# Patient Record
Sex: Male | Born: 1935 | Race: White | Hispanic: No | Marital: Married | Smoking: Never smoker
Health system: Southern US, Community
[De-identification: ages and names within clinical notes are randomized; demographics above are authoritative.]

## PROBLEM LIST (undated history)

## (undated) DIAGNOSIS — I10 Essential (primary) hypertension: Secondary | ICD-10-CM

## (undated) DIAGNOSIS — E7800 Pure hypercholesterolemia, unspecified: Secondary | ICD-10-CM

---

## 2018-12-24 ENCOUNTER — Emergency Department: Payer: Medicare Other

## 2018-12-24 ENCOUNTER — Other Ambulatory Visit: Payer: Self-pay

## 2018-12-24 ENCOUNTER — Encounter: Payer: Self-pay | Admitting: Emergency Medicine

## 2018-12-24 ENCOUNTER — Observation Stay
Admission: EM | Admit: 2018-12-24 | Discharge: 2018-12-25 | Disposition: A | Discharge status: Home / Self Care | Payer: Medicare Other | Attending: Internal Medicine | Admitting: Internal Medicine

## 2018-12-24 DIAGNOSIS — M160 Bilateral primary osteoarthritis of hip: Secondary | ICD-10-CM | POA: Diagnosis not present

## 2018-12-24 DIAGNOSIS — R262 Difficulty in walking, not elsewhere classified: Secondary | ICD-10-CM | POA: Insufficient documentation

## 2018-12-24 DIAGNOSIS — Z7982 Long term (current) use of aspirin: Secondary | ICD-10-CM | POA: Insufficient documentation

## 2018-12-24 DIAGNOSIS — E785 Hyperlipidemia, unspecified: Secondary | ICD-10-CM

## 2018-12-24 DIAGNOSIS — M5416 Radiculopathy, lumbar region: Secondary | ICD-10-CM | POA: Diagnosis not present

## 2018-12-24 DIAGNOSIS — I700 Atherosclerosis of aorta: Secondary | ICD-10-CM | POA: Diagnosis not present

## 2018-12-24 DIAGNOSIS — M8580 Other specified disorders of bone density and structure, unspecified site: Secondary | ICD-10-CM | POA: Diagnosis not present

## 2018-12-24 DIAGNOSIS — I10 Essential (primary) hypertension: Secondary | ICD-10-CM | POA: Insufficient documentation

## 2018-12-24 DIAGNOSIS — Z8042 Family history of malignant neoplasm of prostate: Secondary | ICD-10-CM | POA: Insufficient documentation

## 2018-12-24 DIAGNOSIS — M545 Low back pain, unspecified: Secondary | ICD-10-CM

## 2018-12-24 DIAGNOSIS — G8929 Other chronic pain: Secondary | ICD-10-CM | POA: Insufficient documentation

## 2018-12-24 DIAGNOSIS — E7800 Pure hypercholesterolemia, unspecified: Secondary | ICD-10-CM | POA: Diagnosis not present

## 2018-12-24 DIAGNOSIS — R600 Localized edema: Secondary | ICD-10-CM

## 2018-12-24 DIAGNOSIS — Z79899 Other long term (current) drug therapy: Secondary | ICD-10-CM | POA: Insufficient documentation

## 2018-12-24 DIAGNOSIS — M533 Sacrococcygeal disorders, not elsewhere classified: Secondary | ICD-10-CM | POA: Diagnosis not present

## 2018-12-24 DIAGNOSIS — K4020 Bilateral inguinal hernia, without obstruction or gangrene, not specified as recurrent: Secondary | ICD-10-CM | POA: Insufficient documentation

## 2018-12-24 DIAGNOSIS — M4316 Spondylolisthesis, lumbar region: Secondary | ICD-10-CM | POA: Insufficient documentation

## 2018-12-24 DIAGNOSIS — M48061 Spinal stenosis, lumbar region without neurogenic claudication: Secondary | ICD-10-CM | POA: Diagnosis not present

## 2018-12-24 DIAGNOSIS — M549 Dorsalgia, unspecified: Secondary | ICD-10-CM | POA: Diagnosis present

## 2018-12-24 DIAGNOSIS — Z20828 Contact with and (suspected) exposure to other viral communicable diseases: Secondary | ICD-10-CM | POA: Insufficient documentation

## 2018-12-24 DIAGNOSIS — M5441 Lumbago with sciatica, right side: Secondary | ICD-10-CM | POA: Diagnosis not present

## 2018-12-24 DIAGNOSIS — M5137 Other intervertebral disc degeneration, lumbosacral region: Secondary | ICD-10-CM | POA: Insufficient documentation

## 2018-12-24 DIAGNOSIS — Z809 Family history of malignant neoplasm, unspecified: Secondary | ICD-10-CM | POA: Insufficient documentation

## 2018-12-24 HISTORY — DX: Pure hypercholesterolemia, unspecified: E78.00

## 2018-12-24 HISTORY — DX: Essential (primary) hypertension: I10

## 2018-12-24 LAB — COMPREHENSIVE METABOLIC PANEL
ALT: 19 U/L (ref 0–44)
AST: 23 U/L (ref 15–41)
Albumin: 3.8 g/dL (ref 3.5–5.0)
Alkaline Phosphatase: 44 U/L (ref 38–126)
Anion gap: 12 (ref 5–15)
BUN: 21 mg/dL (ref 8–23)
CO2: 21 mmol/L — ABNORMAL LOW (ref 22–32)
Calcium: 8.9 mg/dL (ref 8.9–10.3)
Chloride: 99 mmol/L (ref 98–111)
Creatinine, Ser: 1 mg/dL (ref 0.61–1.24)
GFR calc Af Amer: >60 mL/min (ref >60)
GFR calc non Af Amer: >60 mL/min (ref >60)
Glucose, Bld: 110 mg/dL — ABNORMAL HIGH (ref 70–99)
Potassium: 3.9 mmol/L (ref 3.5–5.1)
Sodium: 132 mmol/L — ABNORMAL LOW (ref 135–145)
Total Bilirubin: 1.2 mg/dL (ref 0.3–1.2)
Total Protein: 7.1 g/dL (ref 6.5–8.1)

## 2018-12-24 LAB — CBC WITH DIFFERENTIAL/PLATELET
Abs Immature Granulocytes: 0.02 10*3/uL (ref 0.00–0.07)
Basophils Absolute: 0 10*3/uL (ref 0.0–0.1)
Basophils Relative: 0 %
Eosinophils Absolute: 0 10*3/uL (ref 0.0–0.5)
Eosinophils Relative: 1 %
HCT: 40.3 % (ref 39.0–52.0)
Hemoglobin: 14 g/dL (ref 13.0–17.0)
Immature Granulocytes: 0 %
Lymphocytes Relative: 19 %
Lymphs Abs: 1.4 10*3/uL (ref 0.7–4.0)
MCH: 30.5 pg (ref 26.0–34.0)
MCHC: 34.7 g/dL (ref 30.0–36.0)
MCV: 87.8 fL (ref 80.0–100.0)
Monocytes Absolute: 0.7 10*3/uL (ref 0.1–1.0)
Monocytes Relative: 9 %
Neutro Abs: 5.2 10*3/uL (ref 1.7–7.7)
Neutrophils Relative %: 71 %
Platelets: 182 10*3/uL (ref 150–400)
RBC: 4.59 MIL/uL (ref 4.22–5.81)
RDW: 13.2 % (ref 11.5–15.5)
WBC: 7.4 10*3/uL (ref 4.0–10.5)
nRBC: 0 % (ref 0.0–0.2)

## 2018-12-24 MED ORDER — HYDROMORPHONE HCL 1 MG/ML IJ SOLN: 0.5000 mg | INTRAMUSCULAR | Status: DC | PRN

## 2018-12-24 MED ORDER — ONDANSETRON HCL 4 MG/2ML IJ SOLN
4.0000 mg | Freq: Once | INTRAMUSCULAR | Status: AC
  Administered 2018-12-24: 4 mg via INTRAVENOUS
  Filled 2018-12-24: qty 2

## 2018-12-24 MED ORDER — MORPHINE SULFATE (PF) 4 MG/ML IV SOLN
4.0000 mg | Freq: Once | INTRAVENOUS | Status: AC
  Administered 2018-12-24: 4 mg via INTRAVENOUS
  Filled 2018-12-24: qty 1

## 2018-12-24 MED ORDER — HYDROCHLOROTHIAZIDE 12.5 MG PO CAPS
12.5000 mg | ORAL_CAPSULE | Freq: Every day | ORAL | Status: DC
  Administered 2018-12-25: 12.5 mg via ORAL
  Filled 2018-12-24 (×2): qty 1

## 2018-12-24 MED ORDER — HYDROCODONE-ACETAMINOPHEN 5-325 MG PO TABS
1.0000 | ORAL_TABLET | ORAL | Status: DC | PRN
  Administered 2018-12-25: 1 via ORAL
  Filled 2018-12-24: qty 1

## 2018-12-24 MED ORDER — ASPIRIN 81 MG PO CHEW
81.0000 mg | CHEWABLE_TABLET | Freq: Every day | ORAL | Status: DC
  Administered 2018-12-24 – 2018-12-25 (×2): 81 mg via ORAL
  Filled 2018-12-24 (×2): qty 1

## 2018-12-24 MED ORDER — HYDROMORPHONE HCL 1 MG/ML IJ SOLN
0.5000 mg | Freq: Once | INTRAMUSCULAR | Status: AC
  Administered 2018-12-24: 0.5 mg via INTRAVENOUS
  Filled 2018-12-24: qty 1

## 2018-12-24 MED ORDER — LISINOPRIL-HYDROCHLOROTHIAZIDE 10-12.5 MG PO TABS: 1.0000 | ORAL_TABLET | Freq: Every day | ORAL | Status: DC

## 2018-12-24 MED ORDER — ACETAMINOPHEN 325 MG PO TABS: 650.0000 mg | ORAL_TABLET | Freq: Four times a day (QID) | ORAL | Status: DC | PRN

## 2018-12-24 MED ORDER — METHOCARBAMOL 500 MG PO TABS
500.0000 mg | ORAL_TABLET | Freq: Three times a day (TID) | ORAL | Status: DC | PRN
  Filled 2018-12-24: qty 1

## 2018-12-24 MED ORDER — ONDANSETRON HCL 4 MG/2ML IJ SOLN: 4.0000 mg | Freq: Four times a day (QID) | INTRAMUSCULAR | Status: DC | PRN

## 2018-12-24 MED ORDER — ONDANSETRON HCL 4 MG PO TABS: 4.0000 mg | ORAL_TABLET | Freq: Four times a day (QID) | ORAL | Status: DC | PRN

## 2018-12-24 MED ORDER — SODIUM CHLORIDE 0.9 % IV SOLN
INTRAVENOUS | Status: AC
  Administered 2018-12-24 – 2018-12-25 (×2): via INTRAVENOUS

## 2018-12-24 MED ORDER — LISINOPRIL 10 MG PO TABS
10.0000 mg | ORAL_TABLET | Freq: Every day | ORAL | Status: DC
  Administered 2018-12-25: 10 mg via ORAL
  Filled 2018-12-24: qty 1

## 2018-12-24 MED ORDER — ACETAMINOPHEN 650 MG RE SUPP: 650.0000 mg | Freq: Four times a day (QID) | RECTAL | Status: DC | PRN

## 2018-12-24 MED ORDER — ROSUVASTATIN CALCIUM 10 MG PO TABS
10.0000 mg | ORAL_TABLET | Freq: Every day | ORAL | Status: DC
  Administered 2018-12-24 – 2018-12-25 (×2): 10 mg via ORAL
  Filled 2018-12-24 (×3): qty 1

## 2018-12-24 NOTE — ED Notes (Signed)
Report given to Jeanette Caprice, [place]

## 2018-12-24 NOTE — H&P (Signed)
Zachary Nichols [id number] DOB: 06/04/1928 DOA: 12/24/2018     PCP: Derinda Late, MD   Outpatient Specialists: NONE    Patient arrived to ER on 12/24/18 at 1517  Patient coming from: home Lives   With family From facility  [facility]  Chief Complaint:   Chief Complaint  Patient presents with   Back Pain    HPI: Zachary Nichols is a 85 y.o. male with medical history significant of HTN  family moved from [place] last year   Presented with   Worsening back pain and right hip with dificulty ambulating For years but got worse for past years uses a walker  He was having more pain and was told to to go to his PCP but could not get into car and ems was called Reported pain getting worse and not radiating to his right leg Wife states she is unable to help him ambulate no longer.  He has known history of spinal stenosis and have been followed up with pain management at [place] in the past.  Denies any numbness in ER was having good rectal tone no weakness in his lower extremities significant pain on ambulation.  Infectious risk factors:  Reports none    in house  PCR testing  Pending  No results found for: SARSCOV2NAA   Regarding pertinent Chronic problems:    Hyperlipidemia -  on statins crestor   HTN on lisinopril HCTZ      While in ER: Noted to have hard time walking Was given IV pain medication and was able to rest   ER Provider Called: neurosurgery      They Recommend admit to medicine   Will see in AM   MRI of the lumbar spine and sacrum was done but was very degraded by motion there was evidence of multiple significant foraminal stenosis throughout the lumbar spine likely explaining his symptoms   recommend a CT scan which was Done showing spine spondylosis most notable likely at L3-L4 and L4-L5 The following Work up has been ordered so far:  Orders Placed This Encounter  Procedures   SARS CORONAVIRUS 2 (TAT 6-24 HRS) Nasopharyngeal  Nasopharyngeal Swab   MR LUMBAR SPINE WO CONTRAST   MR SACRUM SI JOINTS WO CONTRAST   CT Lumbar Spine Wo Contrast   CT PELVIS WO CONTRAST   Comprehensive metabolic panel   CBC with Differential   Nursing Communication Try to walk patient w/ a walker. Let me see him do it. Conni Slipper MD   Consult to hospitalist  ALL PATIENTS BEING ADMITTED/HAVING PROCEDURES NEED COVID-19 SCREENING wife does not feel she can care for him at home the way he is right now   Consult to social work   Patient needs PT consult as soon as possible.  Discharge is pending PT consult.  Please call  PT eval and treat    Following Medications were ordered in ER: Medications  morphine 4 MG/ML injection 4 mg (4 mg Intravenous Given 12/24/18 1559)  ondansetron (ZOFRAN) injection 4 mg (4 mg Intravenous Given 12/24/18 1559)  HYDROmorphone (DILAUDID) injection 0.5 mg (0.5 mg Intravenous Given 12/24/18 2025)        Consult Orders  (From admission, onward)         Start     Ordered   12/24/18 2049  Consult to social work  Once    Comments: Patient reportedly having trouble walking.  Needs a higher level of care.  Already lives at [facility]  independent living.  Probably could use some physical therapy as well.  Provider:  (Not yet assigned)  Question Answer Comment  Place call to: sw   Reason for Consult Consult      12/24/18 2048   12/24/18 2049  Patient needs PT consult as soon as possible.  Discharge is pending PT consult.  Please call  PT eval and treat  Imminent discharge    Comments: Patient needs PT consult as soon as possible.  Discharge is pending PT consult.  Please call  Question:  Reason for PT?  Answer:  Patient with severe spinal stenosis low back pain having trouble laying down flat and reportedly having trouble walking.   12/24/18 2049   12/24/18 2031  Consult to hospitalist  ALL PATIENTS BEING ADMITTED/HAVING PROCEDURES NEED COVID-19 SCREENING wife does not feel she can care for him at home  the way he is right now  Once    Comments: ALL PATIENTS BEING ADMITTED/HAVING PROCEDURES NEED COVID-19 SCREENING wife does not feel she can care for him at home the way he is right now  Provider:  (Not yet assigned)  Question Answer Comment  Place call to: [phone]   Reason for Consult Admit   Diagnosis/Clinical Info for Consult: Severe back pain unable to lay flat difficulty walking because of the pain      12/24/18 2037            Significant initial  Findings: Abnormal Labs Reviewed  COMPREHENSIVE METABOLIC PANEL - Abnormal; Notable for the following components:      Result Value   Sodium 132 (*)    CO2 21 (*)    Glucose, Bld 110 (*)    All other components within normal limits      Otherwise labs showing:    Recent Labs  Lab 12/24/18 1600  NA 132*  K 3.9  CO2 21*  GLUCOSE 110*  BUN 21  CREATININE 1.00  CALCIUM 8.9    Cr   stable,   Lab Results  Component Value Date   CREATININE 1.00 12/24/2018    Recent Labs  Lab 12/24/18 1600  AST 23  ALT 19  ALKPHOS 44  BILITOT 1.2  PROT 7.1  ALBUMIN 3.8   Lab Results  Component Value Date   CALCIUM 8.9 12/24/2018      WBC      Component Value Date/Time   WBC 7.4 12/24/2018 1600   ANC    Component Value Date/Time   NEUTROABS 5.2 12/24/2018 1600   ALC No components found for: LYMPHAB    Plt: Lab Results  Component Value Date   PLT 182 12/24/2018       HG/HCT  stable,      Component Value Date/Time   HGB 14.0 12/24/2018 1600   HCT 40.3 12/24/2018 1600      Ordered CT/MRI lumbar spine and pelvis showed significant spondylosis at levels L3-L4 and L4-5    ED Triage Vitals  Enc Vitals Group     BP 12/24/18 1527 (!) 160/80     Pulse Rate 12/24/18 1527 75     Resp 12/24/18 1527 18     Temp 12/24/18 1527 98 F (36.7 C)     Temp Source 12/24/18 1527 Oral     SpO2 12/24/18 1527 97 %     Weight 12/24/18 1522 210 lb (95.3 kg)     Height 12/24/18 1522 5\' 8"  (1.727 m)     Head  Circumference --  Peak Flow --      Pain Score 12/24/18 1522 6     Pain Loc --      Pain Edu? --      Excl. in [place]? --   TMAX(24)@       Latest  Blood pressure (!) 160/80, pulse 75, temperature 98 F (36.7 C), temperature source Oral, resp. rate 18, height 5\' 8"  (1.727 m), weight 95.3 kg, SpO2 97 %.    Hospitalist was called for admission for severe back pain   Review of Systems:    Pertinent positives include: Back pain  Constitutional:  No weight loss, night sweats, Fevers, chills, fatigue, weight loss  HEENT:  No headaches, Difficulty swallowing,Tooth/dental problems,Sore throat,  No sneezing, itching, ear ache, nasal congestion, post nasal drip,  Cardio-vascular:  No chest pain, Orthopnea, PND, anasarca, dizziness, palpitations.no Bilateral lower extremity swelling  GI:  No heartburn, indigestion, abdominal pain, nausea, vomiting, diarrhea, change in bowel habits, loss of appetite, melena, blood in stool, hematemesis Resp:  no shortness of breath at rest. No dyspnea on exertion, No excess mucus, no productive cough, No non-productive cough, No coughing up of blood.No change in color of mucus.No wheezing. Skin:  no rash or lesions. No jaundice GU:  no dysuria, change in color of urine, no urgency or frequency. No straining to urinate.  No flank pain.  Musculoskeletal:  No joint pain or no joint swelling. No decreased range of motion.   Psych:  No change in mood or affect. No depression or anxiety. No memory loss.  Neuro: no localizing neurological complaints, no tingling, no weakness, no double vision, no gait abnormality, no slurred speech, no confusion  All systems reviewed and apart from [place] all are negative  Past Medical History:   Past Medical History:  Diagnosis Date   Hypercholesteremia    Hypertension       History reviewed. No pertinent surgical history.  Social History:  Ambulatory   Gilford Rile      reports that he has never smoked. He has  never used smokeless tobacco. He reports that he does not drink alcohol. No history on file for drug.     Family History:   Family History  Problem Relation Age of Onset   Cancer Mother    Bone cancer Mother    Prostate cancer Father     Allergies: No Known Allergies   Prior to Admission medications   Medication Sig Start Date End Date Taking? Authorizing Provider  aspirin 81 MG chewable tablet Chew 81 mg by mouth daily.   Yes [provider]  lisinopril-hydrochlorothiazide (ZESTORETIC) 10-12.5 MG tablet Take 1 tablet by mouth daily.   Yes [provider]  rosuvastatin (CRESTOR) 10 MG tablet Take 10 mg by mouth daily.   Yes [provider]   Physical Exam: Blood pressure (!) 160/80, pulse 75, temperature 98 F (36.7 C), temperature source Oral, resp. rate 18, height 5\' 8"  (1.727 m), weight 95.3 kg, SpO2 97 %. 1. General:  in No  Acute distress when resting in bed  Chronically ill  -appearing 2. Psychological: Alert and  Oriented 3. Head/ENT:    Dry Mucous Membranes                          Head Non traumatic, neck supple                            Poor  Dentition 4. SKIN:   decreased Skin turgor,  Skin clean Dry and intact no rash 5. Heart: Regular rate and rhythm no  Murmur, no Rub or gallop 6. Lungs:  , no wheezes or crackles   7. Abdomen: Soft,  non-tender, Non distended bowel sounds present 8. Lower extremities: no clubbing, cyanosis, slight edema left worse than right 9. Neurologically Grossly intact, moving all 4 extremities equally   10. MSK: Normal range of motion   All other LABS:     Recent Labs  Lab 12/24/18 1600  WBC 7.4  NEUTROABS 5.2  HGB 14.0  HCT 40.3  MCV 87.8  PLT 182     Recent Labs  Lab 12/24/18 1600  NA 132*  K 3.9  CL 99  CO2 21*  GLUCOSE 110*  BUN 21  CREATININE 1.00  CALCIUM 8.9     Recent Labs  Lab 12/24/18 1600  AST 23  ALT 19  ALKPHOS 44  BILITOT 1.2  PROT 7.1  ALBUMIN 3.8         Cultures: No results found for: SDES, SPECREQUEST, CULT, REPTSTATUS   Radiological Exams on Admission: Ct Lumbar Spine Wo Contrast  Result Date: 12/24/2018 CLINICAL DATA:  Lower back pain, worse on the right, recent MRI had limited evaluation EXAM: CT LUMBAR SPINE WITHOUT CONTRAST TECHNIQUE: Multidetector CT imaging of the lumbar spine was performed without intravenous contrast administration. Multiplanar CT image reconstructions were also generated. COMPARISON:  None. FINDINGS: Segmentation: There are 5 non-rib bearing lumbar type vertebral bodies with the last intervertebral disc space labeled as L5-S1. Alignment: There is a dextroconvex scoliotic curvature of the lumbar spine. There is posterior XLif fixation is seen at L3-L4 and L4-L5. Vertebrae: The vertebral body heights are well maintained. No fracture, malalignment, or pathologic osseous lesions seen. Paraspinal and other soft tissues: The paraspinal soft tissues and visualized retroperitoneal structures are unremarkable. Scattered aortic atherosclerosis noted. The sacroiliac joints are intact. Disc levels: T12-L1:  No significant canal or neural foraminal narrowing. L1-L2: There is facet arthrosis which causes severe left and moderate right neural foraminal narrowing. There is also moderate central canal stenosis. L2-L3: Facet arthrosis causes severe left and moderate right neural foraminal narrowing. There is severe central canal stenosis central canal stenosis. L3-L4: There is facet arthrosis which causes moderate bilateral neural foraminal narrowing. There is also severe central canal stenosis, somewhat limited due to overlying streak artifact. L4-L5: There is facet arthrosis which causes severe bilateral neural foraminal narrowing. There also appears to be severe central canal stenosis, somewhat limited due to overlying streak artifact. L5-S1: There is facet arthrosis which causes severe left and moderate right neural foraminal narrowing.  IMPRESSION: 1. No acute fracture or malalignment. 2. Dextroconvex scoliotic curvature of the lumbar spine. 3. Posterior XLIF  at L3-L4 and L4-L5. 4. Lumbar spine spondylosis most notable likely at L3-L4 and L4-L5 although somewhat limited due to overlying streak artifact and technique. Electronically Signed   By: Prudencio Pair M.D.   On: 12/24/2018 20:10   Ct Pelvis Wo Contrast  Result Date: 12/24/2018 CLINICAL DATA:  Enlarged lymph nodes sacral pain EXAM: CT PELVIS WITHOUT CONTRAST TECHNIQUE: Multidetector CT imaging of the pelvis was performed following the standard protocol without intravenous contrast. COMPARISON:  None. FINDINGS: Urinary Tract: The visualized distal ureters and bladder appear unremarkable. Bowel: No bowel wall thickening, distention or surrounding inflammation identified within the pelvis. Scattered colonic diverticula are noted. Vascular/Lymphatic: No enlarged pelvic lymph nodes identified. Scattered aortic atherosclerotic calcifications are seen without aneurysmal  dilatation. Reproductive:   The prostate is mildly enlarged and heterogeneous. Other: Bilateral fat containing inguinal hernias are present. No adenopathy seen within the inguinal regions. Musculoskeletal: No fracture or dislocation is seen. There is diffuse osteopenia. There is moderate to advanced bilateral hip osteoarthritis with superior subchondral cystic changes. There is mild fatty atrophy noted within the muscles surrounding the pelvis. No focal soft tissue swelling or fluid collection. IMPRESSION: 1. No acute fracture or malalignment of the pelvis. 2. Diffuse osteopenia. 3. Moderate to advanced bilateral hip osteoarthritis. 4.  Aortic Atherosclerosis (ICD10-I70.0). 5. Diverticulosis. Electronically Signed   By: Prudencio Pair M.D.   On: 12/24/2018 20:13   Mr Lumbar Spine Wo Contrast  Result Date: 12/24/2018 CLINICAL DATA:  Low back pain.  Right-sided pain with right leg pain EXAM: MRI LUMBAR SPINE AND SACRUM WITHOUT  CONTRAST TECHNIQUE: Multiplanar, multisequence MR imaging of the lumbar spine was performed. No intravenous contrast was administered. COMPARISON:  None. FINDINGS: Segmentation: Exam quality is limited by extensive motion. Multiple repeat sequences however the patient continued to move throughout the study. Normal segmentation. Alignment:  Mild anterolisthesis L3-4.  Dextroscoliosis. Vertebrae: No vertebral fracture. There is hardware related artifact in the posterior elements at L4-5 likely inter spinous spacer. No prior radiograph for correlation. Hemangioma T12 vertebral body. Images of the sacrum were attempted however are greatly degraded by motion. There is also artifact posteriorly at L4-5 due to surgical hardware. No gross fracture or mass in the sacrum is identified. Conus medullaris and cauda equina: Conus not well visualized due to extensive motion. The distal cord and conus are grossly normal. Paraspinal and other soft tissues: No paraspinous mass or fluid collection. Disc levels: L1-2: Mild spinal stenosis. Disc degeneration and facet degeneration contributes to stenosis L2-3: Moderate to severe spinal stenosis due disc degeneration and endplate spurring as well as diffuse facet hypertrophy. Moderate subarticular and foraminal stenosis bilaterally L3-4: Severe spinal stenosis. Disc degeneration and diffuse endplate spurring. Bilateral facet hypertrophy. Moderate to severe subarticular and foraminal encroachment bilaterally L4-5: Moderate disc degeneration and spurring. Mild spinal stenosis. Bilateral facet degeneration with moderate subarticular stenosis bilaterally. Posterior element hardware related artifact likely inter spinous spacer. L5-S1: Disc degeneration and spondylosis. Moderate subarticular stenosis bilaterally left greater than right with impingement of the S1 nerve roots specially on the left. IMPRESSION: This study is markedly degraded by motion on all sequences. The patient does have  multiple areas of significant spinal and foraminal stenosis throughout the lumbar spine which may account for the patient's symptoms. No fracture or mass lesion identified. Additional imaging is likely to be useful in confirming these findings. CT lumbar spine could be performed. Pain management is recommended prior to further imaging. Electronically Signed   By: Franchot Gallo M.D.   On: 12/24/2018 17:27   Mr Sacrum Si Joints Wo Contrast  Result Date: 12/24/2018 CLINICAL DATA:  Low back pain.  Right-sided pain with right leg pain EXAM: MRI LUMBAR SPINE AND SACRUM WITHOUT CONTRAST TECHNIQUE: Multiplanar, multisequence MR imaging of the lumbar spine was performed. No intravenous contrast was administered. COMPARISON:  None. FINDINGS: Segmentation: Exam quality is limited by extensive motion. Multiple repeat sequences however the patient continued to move throughout the study. Normal segmentation. Alignment:  Mild anterolisthesis L3-4.  Dextroscoliosis. Vertebrae: No vertebral fracture. There is hardware related artifact in the posterior elements at L4-5 likely inter spinous spacer. No prior radiograph for correlation. Hemangioma T12 vertebral body. Images of the sacrum were attempted however are greatly degraded by motion. There is also  artifact posteriorly at L4-5 due to surgical hardware. No gross fracture or mass in the sacrum is identified. Conus medullaris and cauda equina: Conus not well visualized due to extensive motion. The distal cord and conus are grossly normal. Paraspinal and other soft tissues: No paraspinous mass or fluid collection. Disc levels: L1-2: Mild spinal stenosis. Disc degeneration and facet degeneration contributes to stenosis L2-3: Moderate to severe spinal stenosis due disc degeneration and endplate spurring as well as diffuse facet hypertrophy. Moderate subarticular and foraminal stenosis bilaterally L3-4: Severe spinal stenosis. Disc degeneration and diffuse endplate spurring.  Bilateral facet hypertrophy. Moderate to severe subarticular and foraminal encroachment bilaterally L4-5: Moderate disc degeneration and spurring. Mild spinal stenosis. Bilateral facet degeneration with moderate subarticular stenosis bilaterally. Posterior element hardware related artifact likely inter spinous spacer. L5-S1: Disc degeneration and spondylosis. Moderate subarticular stenosis bilaterally left greater than right with impingement of the S1 nerve roots specially on the left. IMPRESSION: This study is markedly degraded by motion on all sequences. The patient does have multiple areas of significant spinal and foraminal stenosis throughout the lumbar spine which may account for the patient's symptoms. No fracture or mass lesion identified. Additional imaging is likely to be useful in confirming these findings. CT lumbar spine could be performed. Pain management is recommended prior to further imaging. Electronically Signed   By: Franchot Gallo M.D.   On: 12/24/2018 17:27    Chart has been reviewed    Assessment/Plan  85 y.o. male with medical history significant of HTN  family moved from [place] last year Admitted for severe back pain  Present on Admission:  Back pain - due to spinal stenosis, pain control, PT/OT assessment, Neurosurgery consult and possible placement   Essential hypertension - chronic continue home medications   Hyperlipidemia - stable continue home medications   Leg edema, left - obtain dopplers to further assess   Other plan as per orders.  DVT prophylaxis:  SCD    Code Status:    DNR/DNI  as per patient  I had personally discussed CODE STATUS with patient   Family Communication:   Family  at  Bedside  plan of care was discussed   with  Wife,   Disposition Plan:     likely will need placement for rehabilitation                                                 Would benefit from PT/OT eval prior to DC  Ordered                                          Consults called:    NS aware will see in AM  Admission status:  ED Disposition    ED Disposition Condition Comment   Admit  The patient appears reasonably stabilized for admission considering the current resources, flow, and capabilities available in the ED at this time, and I doubt any other [facility] requiring further screening and/or treatment in the ED prior to admission is  present.       Obs     Level of care        medical floor           Precautions: admitted as  asymptomatic screening protocol  No active isolations    PPE: Used by the provider:   P100  eye Goggles,  Gloves     Zachary Nichols 12/25/2018, 12:42 AM    [facility]     after 2 AM please page floor coverage PA If 7AM-7PM, please contact the day team taking care of the patient using [email or web address]

## 2018-12-24 NOTE — ED Triage Notes (Signed)
Presents via EMS from home  Pt lives at [facility] he is having lower back pain  States pain is worse on the right and moving into right leg

## 2018-12-24 NOTE — ED Provider Notes (Signed)
[facility] Emergency Department Provider Note   ____________________________________________   First MD Initiated Contact with Patient 12/24/18 1527     (approximate)  I have reviewed the triage vital signs and the nursing notes.   HISTORY  Chief Complaint Back Pain   HPI Aithan Baccam is a 85 y.o. male who reports intermittent low back pain off and on for years.  Nothing seems to bring it on or make it better per his report.  Apparently has gotten worse in the last week and is now quite painful in the low back actually in the sacral area.  His wife reports his legs are not working as well.  She says she he had a history of spinal stenosis in the low back in the past.   Patient denies any numbness in his privates.  He has good rectal tone.  He has no weakness in his legs or numbness in his legs per his report.      Past Medical History:  Diagnosis Date   Hypercholesteremia    Hypertension     Patient Active Problem List   Diagnosis Date Noted   Back pain 12/24/2018     Prior to Admission medications   Medication Sig Start Date End Date Taking? Authorizing Provider  aspirin 81 MG chewable tablet Chew 81 mg by mouth daily.   Yes [provider]  lisinopril-hydrochlorothiazide (ZESTORETIC) 10-12.5 MG tablet Take 1 tablet by mouth daily.   Yes [provider]  rosuvastatin (CRESTOR) 10 MG tablet Take 10 mg by mouth daily.   Yes [provider]    Allergies Patient has no known allergies.  Family History  Problem Relation Age of Onset   Cancer Mother    Bone cancer Mother    Prostate cancer Father     Social History Social History   Tobacco Use   Smoking status: Never Smoker   Smokeless tobacco: Never Used  Substance Use Topics   Alcohol use: Never    Frequency: Never   Drug use: Not on file    Review of Systems  Constitutional: No fever/chills Eyes: No visual changes. ENT: No sore  throat. Cardiovascular: Denies chest pain. Respiratory: Denies shortness of breath. Gastrointestinal: No abdominal pain.  No nausea, no vomiting.  No diarrhea.  No constipation. Genitourinary: Negative for dysuria. Musculoskeletal: Negative for back pain. Skin: Negative for rash. Neurological: Negative for headaches, focal weakness    ____________________________________________   PHYSICAL EXAM:  VITAL SIGNS: ED Triage Vitals  Enc Vitals Group     BP 12/24/18 1527 (!) 160/80     Pulse Rate 12/24/18 1527 75     Resp 12/24/18 1527 18     Temp 12/24/18 1527 98 F (36.7 C)     Temp Source 12/24/18 1527 Oral     SpO2 12/24/18 1527 97 %     Weight 12/24/18 1522 210 lb (95.3 kg)     Height 12/24/18 1522 5\' 8"  (1.727 m)     Head Circumference --      Peak Flow --      Pain Score 12/24/18 1522 6     Pain Loc --      Pain Edu? --      Excl. in [place]? --     Constitutional: Alert and oriented. Well appearing and in no acute distress. Eyes: Conjunctivae are normal.  Head: Atraumatic. Nose: No congestion/rhinnorhea. Mouth/Throat: Mucous membranes are moist.  Oropharynx non-erythematous. Neck: No stridor. Cardiovascular: Normal rate, regular rhythm.  Grossly normal heart sounds.  Good peripheral circulation. Respiratory: Normal respiratory effort.  No retractions. Lungs CTAB. Gastrointestinal: Soft and nontender. No distention. No abdominal bruits. No CVA tenderness. Musculoskeletal: No lower extremity tenderness nor edema.   Neurologic:  Normal speech and language. No gross focal neurologic deficits are appreciated. No gait instability. Skin:  Skin is warm, dry and intact. No rash noted. Rectal: Hemoccult negative no masses prostate not tender  ____________________________________________   LABS (all labs ordered are listed, but only abnormal results are displayed)  Labs Reviewed  COMPREHENSIVE METABOLIC PANEL - Abnormal; Notable for the following components:      Result Value    Sodium 132 (*)    CO2 21 (*)    Glucose, Bld 110 (*)    All other components within normal limits  SARS CORONAVIRUS 2 (TAT 6-24 HRS)  CBC WITH DIFFERENTIAL/PLATELET  MAGNESIUM  PHOSPHORUS  TSH  COMPREHENSIVE METABOLIC PANEL  CBC   ____________________________________________  EKG   ____________________________________________  RADIOLOGY  ED MD interpretation: CT and MRI shows spinal stenosis and severe DJD at multiple levels.  Official radiology report(s): Ct Lumbar Spine Wo Contrast  Result Date: 12/24/2018 CLINICAL DATA:  Lower back pain, worse on the right, recent MRI had limited evaluation EXAM: CT LUMBAR SPINE WITHOUT CONTRAST TECHNIQUE: Multidetector CT imaging of the lumbar spine was performed without intravenous contrast administration. Multiplanar CT image reconstructions were also generated. COMPARISON:  None. FINDINGS: Segmentation: There are 5 non-rib bearing lumbar type vertebral bodies with the last intervertebral disc space labeled as L5-S1. Alignment: There is a dextroconvex scoliotic curvature of the lumbar spine. There is posterior XLif fixation is seen at L3-L4 and L4-L5. Vertebrae: The vertebral body heights are well maintained. No fracture, malalignment, or pathologic osseous lesions seen. Paraspinal and other soft tissues: The paraspinal soft tissues and visualized retroperitoneal structures are unremarkable. Scattered aortic atherosclerosis noted. The sacroiliac joints are intact. Disc levels: T12-L1:  No significant canal or neural foraminal narrowing. L1-L2: There is facet arthrosis which causes severe left and moderate right neural foraminal narrowing. There is also moderate central canal stenosis. L2-L3: Facet arthrosis causes severe left and moderate right neural foraminal narrowing. There is severe central canal stenosis central canal stenosis. L3-L4: There is facet arthrosis which causes moderate bilateral neural foraminal narrowing. There is also severe  central canal stenosis, somewhat limited due to overlying streak artifact. L4-L5: There is facet arthrosis which causes severe bilateral neural foraminal narrowing. There also appears to be severe central canal stenosis, somewhat limited due to overlying streak artifact. L5-S1: There is facet arthrosis which causes severe left and moderate right neural foraminal narrowing. IMPRESSION: 1. No acute fracture or malalignment. 2. Dextroconvex scoliotic curvature of the lumbar spine. 3. Posterior XLIF  at L3-L4 and L4-L5. 4. Lumbar spine spondylosis most notable likely at L3-L4 and L4-L5 although somewhat limited due to overlying streak artifact and technique. Electronically Signed   By: Prudencio Pair M.D.   On: 12/24/2018 20:10   Ct Pelvis Wo Contrast  Result Date: 12/24/2018 CLINICAL DATA:  Enlarged lymph nodes sacral pain EXAM: CT PELVIS WITHOUT CONTRAST TECHNIQUE: Multidetector CT imaging of the pelvis was performed following the standard protocol without intravenous contrast. COMPARISON:  None. FINDINGS: Urinary Tract: The visualized distal ureters and bladder appear unremarkable. Bowel: No bowel wall thickening, distention or surrounding inflammation identified within the pelvis. Scattered colonic diverticula are noted. Vascular/Lymphatic: No enlarged pelvic lymph nodes identified. Scattered aortic atherosclerotic calcifications are seen without aneurysmal dilatation. Reproductive:   The  prostate is mildly enlarged and heterogeneous. Other: Bilateral fat containing inguinal hernias are present. No adenopathy seen within the inguinal regions. Musculoskeletal: No fracture or dislocation is seen. There is diffuse osteopenia. There is moderate to advanced bilateral hip osteoarthritis with superior subchondral cystic changes. There is mild fatty atrophy noted within the muscles surrounding the pelvis. No focal soft tissue swelling or fluid collection. IMPRESSION: 1. No acute fracture or malalignment of the pelvis. 2.  Diffuse osteopenia. 3. Moderate to advanced bilateral hip osteoarthritis. 4.  Aortic Atherosclerosis (ICD10-I70.0). 5. Diverticulosis. Electronically Signed   By: Prudencio Pair M.D.   On: 12/24/2018 20:13   Mr Lumbar Spine Wo Contrast  Result Date: 12/24/2018 CLINICAL DATA:  Low back pain.  Right-sided pain with right leg pain EXAM: MRI LUMBAR SPINE AND SACRUM WITHOUT CONTRAST TECHNIQUE: Multiplanar, multisequence MR imaging of the lumbar spine was performed. No intravenous contrast was administered. COMPARISON:  None. FINDINGS: Segmentation: Exam quality is limited by extensive motion. Multiple repeat sequences however the patient continued to move throughout the study. Normal segmentation. Alignment:  Mild anterolisthesis L3-4.  Dextroscoliosis. Vertebrae: No vertebral fracture. There is hardware related artifact in the posterior elements at L4-5 likely inter spinous spacer. No prior radiograph for correlation. Hemangioma T12 vertebral body. Images of the sacrum were attempted however are greatly degraded by motion. There is also artifact posteriorly at L4-5 due to surgical hardware. No gross fracture or mass in the sacrum is identified. Conus medullaris and cauda equina: Conus not well visualized due to extensive motion. The distal cord and conus are grossly normal. Paraspinal and other soft tissues: No paraspinous mass or fluid collection. Disc levels: L1-2: Mild spinal stenosis. Disc degeneration and facet degeneration contributes to stenosis L2-3: Moderate to severe spinal stenosis due disc degeneration and endplate spurring as well as diffuse facet hypertrophy. Moderate subarticular and foraminal stenosis bilaterally L3-4: Severe spinal stenosis. Disc degeneration and diffuse endplate spurring. Bilateral facet hypertrophy. Moderate to severe subarticular and foraminal encroachment bilaterally L4-5: Moderate disc degeneration and spurring. Mild spinal stenosis. Bilateral facet degeneration with moderate  subarticular stenosis bilaterally. Posterior element hardware related artifact likely inter spinous spacer. L5-S1: Disc degeneration and spondylosis. Moderate subarticular stenosis bilaterally left greater than right with impingement of the S1 nerve roots specially on the left. IMPRESSION: This study is markedly degraded by motion on all sequences. The patient does have multiple areas of significant spinal and foraminal stenosis throughout the lumbar spine which may account for the patient's symptoms. No fracture or mass lesion identified. Additional imaging is likely to be useful in confirming these findings. CT lumbar spine could be performed. Pain management is recommended prior to further imaging. Electronically Signed   By: Franchot Gallo M.D.   On: 12/24/2018 17:27   Mr Sacrum Si Joints Wo Contrast  Result Date: 12/24/2018 CLINICAL DATA:  Low back pain.  Right-sided pain with right leg pain EXAM: MRI LUMBAR SPINE AND SACRUM WITHOUT CONTRAST TECHNIQUE: Multiplanar, multisequence MR imaging of the lumbar spine was performed. No intravenous contrast was administered. COMPARISON:  None. FINDINGS: Segmentation: Exam quality is limited by extensive motion. Multiple repeat sequences however the patient continued to move throughout the study. Normal segmentation. Alignment:  Mild anterolisthesis L3-4.  Dextroscoliosis. Vertebrae: No vertebral fracture. There is hardware related artifact in the posterior elements at L4-5 likely inter spinous spacer. No prior radiograph for correlation. Hemangioma T12 vertebral body. Images of the sacrum were attempted however are greatly degraded by motion. There is also artifact posteriorly at L4-5 due  to surgical hardware. No gross fracture or mass in the sacrum is identified. Conus medullaris and cauda equina: Conus not well visualized due to extensive motion. The distal cord and conus are grossly normal. Paraspinal and other soft tissues: No paraspinous mass or fluid  collection. Disc levels: L1-2: Mild spinal stenosis. Disc degeneration and facet degeneration contributes to stenosis L2-3: Moderate to severe spinal stenosis due disc degeneration and endplate spurring as well as diffuse facet hypertrophy. Moderate subarticular and foraminal stenosis bilaterally L3-4: Severe spinal stenosis. Disc degeneration and diffuse endplate spurring. Bilateral facet hypertrophy. Moderate to severe subarticular and foraminal encroachment bilaterally L4-5: Moderate disc degeneration and spurring. Mild spinal stenosis. Bilateral facet degeneration with moderate subarticular stenosis bilaterally. Posterior element hardware related artifact likely inter spinous spacer. L5-S1: Disc degeneration and spondylosis. Moderate subarticular stenosis bilaterally left greater than right with impingement of the S1 nerve roots specially on the left. IMPRESSION: This study is markedly degraded by motion on all sequences. The patient does have multiple areas of significant spinal and foraminal stenosis throughout the lumbar spine which may account for the patient's symptoms. No fracture or mass lesion identified. Additional imaging is likely to be useful in confirming these findings. CT lumbar spine could be performed. Pain management is recommended prior to further imaging. Electronically Signed   By: Franchot Gallo M.D.   On: 12/24/2018 17:27    ____________________________________________   PROCEDURES  Procedure(s) performed (including Critical Care):  Procedures   ____________________________________________   INITIAL IMPRESSION / ASSESSMENT AND PLAN / ED COURSE  Patient is unable to lay flat.  Has a lot of pain.  Wife feels she cannot care for him the way he is now at home by herself.  We will see if we can get him in the hospital at least overnight and arrange some home health or physical therapy etc.    I also spoke to Dr. Lacinda Axon neurosurgery he will get Dr. Cari Caraway to take a look at  the patient tomorrow and see if there is anything they can do surgically. Patient is able to walk although with difficulty in his right leg seems to be a little less strong than the left.  Left is the leg with the artificial knee unit.  Patient additionally gets out of breath after walking about 15 feet.         ____________________________________________   FINAL CLINICAL IMPRESSION(S) / ED DIAGNOSES  Final diagnoses:  Bilateral low back pain, unspecified chronicity, unspecified whether sciatica present     ED Discharge Orders    None       Note:  This document was prepared using Dragon voice recognition software and may include unintentional dictation errors.    Nena Polio, MD 12/24/18 2219

## 2018-12-25 ENCOUNTER — Observation Stay: Payer: Medicare Other

## 2018-12-25 DIAGNOSIS — E785 Hyperlipidemia, unspecified: Secondary | ICD-10-CM | POA: Diagnosis present

## 2018-12-25 DIAGNOSIS — I10 Essential (primary) hypertension: Secondary | ICD-10-CM | POA: Diagnosis present

## 2018-12-25 DIAGNOSIS — M48061 Spinal stenosis, lumbar region without neurogenic claudication: Secondary | ICD-10-CM | POA: Diagnosis not present

## 2018-12-25 DIAGNOSIS — M5441 Lumbago with sciatica, right side: Secondary | ICD-10-CM

## 2018-12-25 DIAGNOSIS — R600 Localized edema: Secondary | ICD-10-CM | POA: Diagnosis present

## 2018-12-25 LAB — CBC
HCT: 42.8 % (ref 39.0–52.0)
Hemoglobin: 14.4 g/dL (ref 13.0–17.0)
MCH: 30.7 pg (ref 26.0–34.0)
MCHC: 33.6 g/dL (ref 30.0–36.0)
MCV: 91.3 fL (ref 80.0–100.0)
Platelets: 199 10*3/uL (ref 150–400)
RBC: 4.69 MIL/uL (ref 4.22–5.81)
RDW: 13 % (ref 11.5–15.5)
WBC: 10.3 10*3/uL (ref 4.0–10.5)
nRBC: 0 % (ref 0.0–0.2)

## 2018-12-25 LAB — COMPREHENSIVE METABOLIC PANEL
ALT: 23 U/L (ref 0–44)
AST: 29 U/L (ref 15–41)
Albumin: 4 g/dL (ref 3.5–5.0)
Alkaline Phosphatase: 45 U/L (ref 38–126)
Anion gap: 9 (ref 5–15)
BUN: 18 mg/dL (ref 8–23)
CO2: 24 mmol/L (ref 22–32)
Calcium: 9 mg/dL (ref 8.9–10.3)
Chloride: 101 mmol/L (ref 98–111)
Creatinine, Ser: 1.04 mg/dL (ref 0.61–1.24)
GFR calc Af Amer: >60 mL/min (ref >60)
GFR calc non Af Amer: >60 mL/min (ref >60)
Glucose, Bld: 111 mg/dL — ABNORMAL HIGH (ref 70–99)
Potassium: 3.8 mmol/L (ref 3.5–5.1)
Sodium: 134 mmol/L — ABNORMAL LOW (ref 135–145)
Total Bilirubin: 1.1 mg/dL (ref 0.3–1.2)
Total Protein: 7.6 g/dL (ref 6.5–8.1)

## 2018-12-25 LAB — MAGNESIUM: Magnesium: 2 mg/dL (ref 1.7–2.4)

## 2018-12-25 LAB — SARS CORONAVIRUS 2 (TAT 6-24 HRS): SARS Coronavirus 2: NEGATIVE

## 2018-12-25 LAB — TSH: TSH: 1.021 u[IU]/mL (ref 0.350–4.500)

## 2018-12-25 LAB — PHOSPHORUS: Phosphorus: 2.9 mg/dL (ref 2.5–4.6)

## 2018-12-25 NOTE — TOC Initial Note (Signed)
Transition of Care [facility]) - Initial/Assessment Note    Patient Details  Name: Zachary Nichols MRN: [id number] Date of Birth: 11-25-1928  Transition of Care [facility]) CM/SW Contact:    Shelbie Hutching, RN Phone Number: 12/25/2018, 2:26 PM  Clinical Narrative:                 Patient placed under observation for back pain.  Patient is medically ready for discharge and will discharge back to [facility] independent living with his wife.  Wife is providing transportation back to [facility].  Patient is current with PCP.  PT has worked with patient here in the hospital and recommends home health PT and OT- orders will be faxed to [facility] center at [phone].    Expected Discharge Plan: Home/Self Care Barriers to Discharge: Barriers Resolved, No Barriers Identified   Patient Goals and CMS Choice Patient states their goals for this hospitalization and ongoing recovery are:: Ready to go home      Expected Discharge Plan and Services Expected Discharge Plan: Home/Self Care       Living arrangements for the past 2 months: [place] Expected Discharge Date: 12/25/18                                    Prior Living Arrangements/Services Living arrangements for the past 2 months: [place] Lives with:: Spouse Patient language and need for interpreter reviewed:: Yes Do you feel safe going back to the place where you live?: Yes      Need for Family Participation in Patient Care: Yes (Comment) Care giver support system in place?: Yes (comment)(wife) Current home services: DME([facility]) Criminal Activity/Legal Involvement Pertinent to Current Situation/Hospitalization: No - Comment as needed  Activities of Daily Living      Permission Sought/Granted Permission sought to share information with : Case Manager, Customer service manager Permission granted to share information with : Yes, Verbal Permission Granted     Permission  granted to share info w AGENCY: [facility] granted to share info w Relationship: Wife     Emotional Assessment Appearance:: Appears stated age Attitude/Demeanor/Rapport: Engaged Affect (typically observed): Accepting Orientation: : Oriented to Self, Oriented to Place Alcohol / Substance Use: Not Applicable Psych Involvement: No (comment)  Admission diagnosis:  Leg edema, left [R60.0] Bilateral low back pain, unspecified chronicity, unspecified whether sciatica present [M54.5] Patient Active Problem List   Diagnosis Date Noted  . Essential hypertension 12/25/2018  . Hyperlipidemia 12/25/2018  . Leg edema, left 12/25/2018  . Back pain 12/24/2018   PCP:  Derinda Late, MD Pharmacy:   [place], [place] - [address] [place] [address] Phone: [phone] Fax: [phone]     Social Determinants of Health (SDOH) Interventions    Readmission Risk Interventions No flowsheet data found.

## 2018-12-25 NOTE — Evaluation (Signed)
Physical Therapy Evaluation Patient Details Name: Zachary Nichols MRN: [id number] DOB: July 18, 1928 Today's Date: 12/25/2018   History of Present Illness  Patient is a pleasant 85 year old male who presents with worsening back pain and R hip pain. Patient is confused in regards to history, he is able to state name and location but majority of history taken from previous documentation. Patient moved from [place] last year to [place]. Patient uses a walker at baseline and has a history of spinal stenosis.  Clinical Impression  Patient is a pleasant 85 year old male who presents with generalized LE weakness with RLE>LLE and pain in low back limiting mobility and transfers. Patient ambulates with excessive trunk flexion and reports 9/10 pain in RLE, specifically in the back of his knee with weightbearing/stance phase. Patient is pleasantly confused throughout session requiring cueing for safety and task orientation. He tolerated sitting EOB reaching inside/outside BOS but did demonstrate an instance of posterior lean due to fatigue and pain. Patient unable to state reason for being at hospital but upon reminder he declared "I knew that". Patient's history primarily obtained through previous records. Patient would benefit from skilled physical therapy to increase strength, stability, and to decrease pain and falls risk. Patient will benefit from home health PT and an aide at home due to limited safety awareness, falls risk, and pain levels.     Follow Up Recommendations Home health PT;Supervision for mobility/OOB    Equipment Recommendations  3in1 (PT)    Recommendations for Other Services       Precautions / Restrictions Precautions Precautions: Fall Restrictions Weight Bearing Restrictions: No Other Position/Activity Restrictions: pain in RLE with weightbearing: cleared by imaging      Mobility  Bed Mobility Overal bed mobility: Modified Independent              General bed mobility comments: supine to sit EOB with extra time and pain increase 1-2 points  Transfers Overall transfer level: Needs assistance Equipment used: Rolling walker (2 wheeled) Transfers: Sit to/from Stand Sit to Stand: Min guard         General transfer comment: STS from lowered bed position performed with CGA, patient reports slight increase in pain but is functional.  Ambulation/Gait Ambulation/Gait assistance: Min guard Gait Distance (Feet): 32 Feet Assistive device: Rolling walker (2 wheeled)   Gait velocity: decreased   General Gait Details: Patient ambulates with excessive trunk flexion,reports 9/10 pain in posterior aspect of R knee and throughout RLE with weightbearing/stance phase. Able to functionally ambulate despite report of pain to restroom and then door and back to bed without LOB with CGA  Stairs            Wheelchair Mobility    Modified Rankin (Stroke Patients Only)       Balance Overall balance assessment: Needs assistance Sitting-balance support: Feet supported Sitting balance-Leahy Scale: Fair Sitting balance - Comments: painful sitting EOB however is able to tolerate reaching inside/outside BOS with one incidence of posterior lean Postural control: Posterior lean Standing balance support: Bilateral upper extremity supported Standing balance-Leahy Scale: Fair Standing balance comment: Patient requires use of BUE support on AD for ambulation.                             Pertinent Vitals/Pain Pain Assessment: 0-10 Pain Score: 9 (initially reports no pain when supine in bed. reports 9/10 pain in RLE not back when ambulating) Pain Location: RLE  primarily in posterior knee region with ambulation Pain Descriptors / Indicators: Squeezing;Jabbing Pain Intervention(s): Limited activity within patient's tolerance;Monitored during session;Repositioned    Home Living Family/patient expects to be discharged to:: Assisted  living               Home Equipment: Walker - 4 wheels;Shower seat Additional Comments: [place] with wife. Patient unable to describe further details beyond having a walker and shower chair.    Prior Function Level of Independence: Independent with assistive device(s)         Comments: Per patient he dresses and performs ADLs independently;     Hand Dominance        Extremity/Trunk Assessment   Upper Extremity Assessment Upper Extremity Assessment: Defer to OT evaluation    Lower Extremity Assessment Lower Extremity Assessment: Generalized weakness;RLE deficits/detail;LLE deficits/detail RLE Deficits / Details: hip grossly 4-/5 knee 4-/5 with pain in hamstring, foot 4/5 RLE Sensation: (patient confused, unable to determine difference) RLE Coordination: decreased gross motor LLE Deficits / Details: grossly 4/5 LLE Sensation: (patient confused and unable to differentiate touch.) LLE Coordination: decreased gross motor    Cervical / Trunk Assessment Cervical / Trunk Assessment: (extreme trunk flexion in standing)  Communication   Communication: HOH  Cognition Arousal/Alertness: Awake/alert Behavior During Therapy: Impulsive Overall Cognitive Status: No family/caregiver present to determine baseline cognitive functioning                                 General Comments: Patient is oriented to self and location, confused in regards to living situation, reason for hospital visit, and history.      General Comments General comments (skin integrity, edema, etc.): trunk kyphosis noted    Exercises Other Exercises Other Exercises: patient educated on safe mobility and transfers with use of breathing for pain reduction with movement for decreased fall risk. Other Exercises: seated EOB reaching with one episode of posterior lean. seated ankle pumps 10x, marching 10x, LAQ 5x (slight pain in back so terminated)   Assessment/Plan     PT Assessment Patient needs continued PT services  PT Problem List Decreased strength;Decreased activity tolerance;Decreased balance;Decreased cognition;Decreased coordination;Decreased mobility;Decreased knowledge of precautions;Pain       PT Treatment Interventions DME instruction;Gait training;Functional mobility training;Neuromuscular re-education;Balance training;Therapeutic exercise;Therapeutic activities;Patient/family education;Manual techniques    PT Goals (Current goals can be found in the Care Plan section)  Acute Rehab PT Goals Patient Stated Goal: to return home PT Goal Formulation: With patient Time For Goal Achievement: 01/08/19 Potential to Achieve Goals: Fair    Frequency Min 2X/week   Barriers to discharge Decreased caregiver support Patient will require Home health aide and PT    Co-evaluation               AM-PAC PT "6 Clicks" Mobility  Outcome Measure Help needed turning from your back to your side while in a flat bed without using bedrails?: None Help needed moving from lying on your back to sitting on the side of a flat bed without using bedrails?: A Little Help needed moving to and from a bed to a chair (including a wheelchair)?: A Little Help needed standing up from a chair using your arms (e.g., wheelchair or bedside chair)?: None Help needed to walk in hospital room?: A Little Help needed climbing 3-5 steps with a railing? : A Lot 6 Click Score: 19    End of Session Equipment Utilized During  Treatment: Gait belt Activity Tolerance: Patient limited by pain Patient left: in bed;with call bell/phone within reach;with bed alarm set Nurse Communication: Mobility status PT Visit Diagnosis: Unsteadiness on feet (R26.81);Other abnormalities of gait and mobility (R26.89);Muscle weakness (generalized) (M62.81);Difficulty in walking, not elsewhere classified (R26.2);Pain Pain - Right/Left: Right Pain - part of body: Leg(and back)    Time:  [id number] PT Time Calculation (min) (ACUTE ONLY): 17 min   Charges:   PT Evaluation $PT Eval Low Complexity: 1 Low         Janna Arch, PT, DPT    Janna Arch 12/25/2018, 9:34 AM

## 2018-12-25 NOTE — Consult Note (Signed)
Referring Physician:  No referring provider defined for this encounter.  Primary Physician:  Derinda Late, MD  Chief Complaint:  Back pain  History of Present Illness: Zachary Nichols is a 85 y.o. male who presents as a consult for back pain and lumbar stenosis.  Wife present in room to help provide history.  States that long history of back issues with history of back surgery in 2011.  According to previous surgical documentation, L3-4, L4-5 lumbar laminectomy was performed and spacer inserted at L3-4.  Continue to have complaints of persistent back pain and was under the management of pain clinic, receiving injections when they were residing at [facility] in [place].  They recently moved to [place] to reside in the independent living section of [facility].  He has not been under the care of pain clinic or received injections since moving.  He feels that the injections have helped to control his back complaints. He was brought to the emergency department after experiencing progressively worsening back pain for the last week.  Complains of anterior thigh pain bilaterally and general weakness of the lower extremities.  No other lower extremity complaints noted.  He feels that complaints have improved at this time.  Pain currently rated "less than 5".  Able to ambulate with walker.  Denies bowel/bladder dysfunction or saddle paresthesia in relation to cauda equina syndrome.  Denies recent fall/trauma   Review of Systems:  A 10 point review of systems is negative, except for the pertinent positives and negatives detailed in the HPI.  Past Medical History: Past Medical History:  Diagnosis Date  . Hypercholesteremia   . Hypertension     Past Surgical History: History reviewed. No pertinent surgical history.  Allergies: Allergies as of 12/24/2018  . (No Known Allergies)    Medications:  Current Facility-Administered Medications:  .  acetaminophen (TYLENOL) tablet 650 mg, 650  mg, Oral, Q6H PRN **OR** acetaminophen (TYLENOL) suppository 650 mg, 650 mg, Rectal, Q6H PRN, Doutova, Anastassia, MD .  aspirin chewable tablet 81 mg, 81 mg, Oral, Daily, Doutova, Anastassia, MD, 81 mg at 12/25/18 1059 .  lisinopril (ZESTRIL) tablet 10 mg, 10 mg, Oral, Daily, 10 mg at 12/25/18 1059 **AND** hydrochlorothiazide (MICROZIDE) capsule 12.5 mg, 12.5 mg, Oral, Daily, Hallaji, Sheema M, RPH, 12.5 mg at 12/25/18 1100 .  HYDROcodone-acetaminophen (NORCO/VICODIN) 5-325 MG per tablet 1-2 tablet, 1-2 tablet, Oral, Q4H PRN, Toy Baker, MD, 1 tablet at 12/25/18 0540 .  HYDROmorphone (DILAUDID) injection 0.5-1 mg, 0.5-1 mg, Intravenous, Q4H PRN, Doutova, Anastassia, MD .  methocarbamol (ROBAXIN) tablet 500 mg, 500 mg, Oral, Q8H PRN, Doutova, Anastassia, MD .  ondansetron (ZOFRAN) tablet 4 mg, 4 mg, Oral, Q6H PRN **OR** ondansetron (ZOFRAN) injection 4 mg, 4 mg, Intravenous, Q6H PRN, Doutova, Anastassia, MD .  rosuvastatin (CRESTOR) tablet 10 mg, 10 mg, Oral, Daily, Doutova, Anastassia, MD, 10 mg at 12/25/18 1100   Social History: Social History   Tobacco Use  . Smoking status: Never Smoker  . Smokeless tobacco: Never Used  Substance Use Topics  . Alcohol use: Never    Frequency: Never  . Drug use: Not on file    Family Medical History: Family History  Problem Relation Age of Onset  . Cancer Mother   . Bone cancer Mother   . Prostate cancer Father     Physical Examination: Vitals:   12/25/18 0647 12/25/18 0837  BP: (!) 165/90 (!) 154/97  Pulse: 84 87  Resp:    Temp: 98.4 F (36.9 C) 97.9  F (36.6 C)  SpO2: 96% 99%     General: Patient is well developed, well nourished, calm, collected, and in no apparent distress.  Sitting in hospital chair  Psychiatric: Patient is non-anxious.  ENT:  Oral mucosa appears well hydrated.  Respiratory: Patient is breathing without any difficulty.  Skin:   On exposed skin, there are no abnormal skin lesions.  NEUROLOGICAL:   General: In no acute distress.   Awake, alert, oriented to person and place.  ROM of spine: Limited extension.  Stands in lumbar flexion position.   Palpation of spine: nontender.    Strength:  Side Iliopsoas Quads Hamstring PF DF EHL  R 5 5 5 5 5 5   L 5 5 5 5 5 5    Reflexes are 2+ and symmetric at the patella and achilles.   Bilateral upper and lower extremity sensation is intact to light touch.  Clonus is not present.  Gait not assessed. Able to transition from seated to standing position with minimal difficulty or pain.   Imaging:  EXAM: CT LUMBAR SPINE WITHOUT CONTRAST 12/24/2018  IMPRESSION: 1. No acute fracture or malalignment. 2. Dextroconvex scoliotic curvature of the lumbar spine. 3. Posterior XLIF  at L3-L4 and L4-L5. 4. Lumbar spine spondylosis most notable likely at L3-L4 and L4-L5 although somewhat limited due to overlying streak artifact and technique.   EXAM: MRI LUMBAR SPINE AND SACRUM WITHOUT CONTRAST 12/24/2018  IMPRESSION: This study is markedly degraded by motion on all sequences. The patient does have multiple areas of significant spinal and foraminal stenosis throughout the lumbar spine which may account for the patient's symptoms. No fracture or mass lesion identified.  Additional imaging is likely to be useful in confirming these findings. CT lumbar spine could be performed. Pain management is recommended prior to further imaging.  Assessment and Plan: Zachary Nichols is a pleasant 85 y.o. male with acute exacerbation of chronic back pain as well as mild radicular complaints.  No weakness noted on physical exam when patient was in a sitting position.  Overall pain is improving and he feels it is tolerable at this time.  Also feels that he is able to perform normal daily activities.  Despite the findings on imaging, symptoms seem to be well controlled.  He is requesting to go home.  Recommended follow-up in clinic next week to monitor  progress.  Marin Olp, PA-C Dept. of Neurosurgery

## 2018-12-25 NOTE — Evaluation (Signed)
Occupational Therapy Evaluation Patient Details Name: Zachary Nichols MRN: [id number] DOB: [birth year] Today's Date: 12/25/2018    History of Present Illness Patient is 85 y/o M who presents with worsening back pain and R hip pain. Patient is confused in regards to history, he is able to state name and location but majority of history taken from previous documentation. Patient moved from [place] last year to [place]. Patient uses a walker at baseline and has a history of spinal stenosis.   Clinical Impression   Pt seen for OT evaluation this date. Prior to hospital admission, pt reports being Indep with ADLs and using 4WW occasionally.  Pt lives in [place] with his spouse.  Currently pt demonstrates impairments in safety awareness, standing fxl activity tolerance, and pain requiring CGA-MIN A with fxl mobility and standing ADLs. Pt would benefit from skilled OT to address noted impairments and functional limitations (see below for any additional details) in order to maximize safety and independence while minimizing falls risk and caregiver burden.  Upon hospital discharge, recommend pt discharge to [facility] and 24/7 supv for safety.    Follow Up Recommendations  Home health OT;Supervision/Assistance - 24 hour    Equipment Recommendations  None recommended by OT;Other (comment)(if pt's account of having shower seat is accurate)    Recommendations for Other Services       Precautions / Restrictions Precautions Precautions: Fall Restrictions Weight Bearing Restrictions: No Other Position/Activity Restrictions: pain in RLE with weightbearing: cleared by imaging      Mobility Bed Mobility Overal bed mobility: Modified Independent             General bed mobility comments: increased time required, HOB elevated and minimal use of bed rail.  Transfers Overall transfer level: Needs assistance Equipment used: Rolling walker (2 wheeled) Transfers: Sit  to/from Stand Sit to Stand: Min guard;Min assist         General transfer comment:     Balance Overall balance assessment: Needs assistance Sitting-balance support: Feet supported Sitting balance-Leahy Scale: Good Sitting balance - Comments:   Standing balance support: Bilateral upper extremity supported Standing balance-Leahy Scale: Fair Standing balance comment: BUE supported and MIN A to CGA during fxl mobility for safety. Pt's arms noted to be shaking as he fatigued with fxl mobiltiy around perimeter of bed to recliner.                           ADL either performed or assessed with clinical judgement   ADL Overall ADL's : Needs assistance/impaired Eating/Feeding: Set up;Sitting   Grooming: Wash/dry hands;Wash/dry face;Oral care;Set up;Sitting   Upper Body Bathing: Set up;Supervision/ safety;Sitting   Lower Body Bathing: Minimal assistance;Moderate assistance;Sit to/from stand   Upper Body Dressing : Set up;Sitting   Lower Body Dressing: Minimal assistance;Sit to/from stand Lower Body Dressing Details (indicate cue type and reason): Pt able to don/doff socks sitting I'ly, however, requires MIN A for standing balance for clothing mgt over hips. Toilet Transfer: Minimal assistance;Ambulation;Grab bars;RW   Toileting- Clothing Manipulation and Hygiene: Minimal assistance;Moderate assistance;Sit to/from stand       Functional mobility during ADLs: Minimal assistance;Rolling walker(pt with somewhat flexed hip and trunk standing and fxl mobility posture. Pt does not correct with verbal/tactile cueing from OT. Demos several unsafe/impulsive behaviors during fxl mobility including attempting to funiture walk.)       Vision Baseline Vision/History: Wears glasses Patient Visual Report: No change from baseline  Perception     Praxis      Pertinent Vitals/Pain Pain Assessment: 0-10 Pain Score: 6  Pain Location: R LE (pt initially states no pain when in  bed, endorses pain in R LE with mobility, no c/o back pain). Pain Descriptors / Indicators: Tightness;Sore Pain Intervention(s): Limited activity within patient's tolerance;Monitored during session;Repositioned(transfer to chair with LEs elevated for comfort, pt reports reduced pain sitting in chair.)     Hand Dominance     Extremity/Trunk Assessment Upper Extremity Assessment Upper Extremity Assessment: RUE deficits/detail;LUE deficits/detail;Generalized weakness RUE Deficits / Details: shoulder, elbow grossly >3/5, grip 4-/5 LUE Deficits / Details: shoulder, elbow grossly >3/5, grip 4-/5   Lower Extremity Assessment Lower Extremity Assessment: Defer to PT evaluation;Generalized weakness RLE Deficits / Details: hip grossly 4-/5 knee 4-/5 with pain in hamstring, foot 4/5 RLE Sensation: (patient confused, unable to determine difference) RLE Coordination: decreased gross motor LLE Deficits / Details: grossly 4/5 LLE Sensation: (patient confused and unable to differentiate touch.) LLE Coordination: decreased gross motor   Cervical / Trunk Assessment Cervical / Trunk Assessment: (moderate flexion in hips/trunk in standing, does not extend to full stand)   Communication Communication Communication: HOH   Cognition Arousal/Alertness: Awake/alert Behavior During Therapy: Impulsive(tries to stand before OT is ready to assist for safety.) Overall Cognitive Status: No family/caregiver present to determine baseline cognitive functioning                                 General Comments: Patient is oriented to self and some of location (knows he is [employer], eventually [employer], does not know name of hospital), confused in regards to home layout, PLOF, time and situation (if year eliminated to 3 options, pt correctly guesses 2020).   General Comments      Exercises Other Exercises Other Exercises: OT facilitates education with pt re: general fall prevention  strategies including use of call bell and FWW and notfies pt of use of chair alarm. Other Exercises: seated EOB reaching with one episode of posterior lean. seated ankle pumps 10x, marching 10x, LAQ 5x (slight pain in back so terminated)   Shoulder Instructions      Home Living Family/patient expects to be discharged to:: Assisted living                             Home Equipment: Walker - 4 wheels;Shower seat   Additional Comments: [place] with wife. Patient unable to describe further details beyond having a walker and shower chair.      Prior Functioning/Environment Level of Independence: Independent with assistive device(s)        Comments: Pt states he performs all self care I'ly, reports that he and his wife both still drive. Pt is questionable hisrorian as he is somewhat confused.        OT Problem List: Decreased strength;Decreased activity tolerance;Impaired balance (sitting and/or standing);Decreased cognition;Decreased safety awareness;Decreased knowledge of use of DME or AE;Decreased knowledge of precautions;Pain      OT Treatment/Interventions: Self-care/ADL training;Therapeutic exercise;DME and/or AE instruction;Therapeutic activities;Energy conservation;Patient/family education;Balance training    OT Goals(Current goals can be found in the care plan section) Acute Rehab OT Goals Patient Stated Goal: to return home OT Goal Formulation: With patient Time For Goal Achievement: 01/08/19 Potential to Achieve Goals: Good  OT Frequency: Min 2X/week   Barriers to D/C:  Co-evaluation              AM-PAC OT "6 Clicks" Daily Activity     Outcome Measure Help from another person eating meals?: None Help from another person taking care of personal grooming?: A Little Help from another person toileting, which includes using toliet, bedpan, or urinal?: A Little Help from another person bathing (including washing, rinsing,  drying)?: A Little Help from another person to put on and taking off regular upper body clothing?: None Help from another person to put on and taking off regular lower body clothing?: A Little 6 Click Score: 20   End of Session Equipment Utilized During Treatment: Rolling walker(Pt starts trying to ambulate before OT has time to don gait belt while pt is seated EOB.) Nurse Communication: Mobility status(notified how pt left. In addition, MD presents at end of OT session and OT communicates to MD pt's participation and OT recommendations)  Activity Tolerance: Patient tolerated treatment well Patient left: in chair;with call bell/phone within reach;with chair alarm set(with Telesitter)  OT Visit Diagnosis: Unsteadiness on feet (R26.81);Muscle weakness (generalized) (M62.81)                Time: [id number] OT Time Calculation (min): 23 min Charges:  OT General Charges $OT Visit: 1 Visit OT Evaluation $OT Eval Moderate Complexity: 1 Mod OT Treatments $Self Care/Home Management : 8-22 mins  Gerrianne Scale, MS, OTR/L ascom [phone] 12/25/18, 12:00 PM

## 2018-12-25 NOTE — Discharge Summary (Signed)
Physician Discharge Summary  Zachary Nichols [id number] DOB: [birth year] DOA: 12/24/2018  PCP: Derinda Late, MD  Admit date: 12/24/2018 Discharge date: 12/25/2018  Admitted From: Independent Living at [facility]  Disposition:  Independent Living   Recommendations for Outpatient Follow-up:  1. Follow up with PCP in 1-2 weeks 2. Please follow up Dr. Izora Ribas Neurosurgery in 2-3 weeks as directed 3. Patient to follow up with outpatient physical therapy and occupational therapy at [facility]       Discharge Condition: Good  CODE STATUS: DO NOT RESUSCITATE Diet recommendation: Regular  Brief/Interim Summary: Zachary Nichols is an 85 y.o. M with HTN who presented with several years back pain, now worsening in the last few weeks, and finally so severe on day of admission that patient couldn't get in car, EMS had to be called.  In the ER, he had no numbness, weakness or loss of rectal tone.  Unable to walk due to pain.  Started on IV pain medication and admitted to the hospital.       [place] DIAGNOSIS: Radiculopathy    Discharge Diagnoses:   Degenerative spine disease with radiculopathy Patient admitted overnight.  Pain treated with opiates and responded well.  In AM, patient able to ambulate short distances with PT and OT, who recommended home health therapy.    Neurosurgery were consulted who recommended conservative management, likely epidural injections, to be determined at follow up in a short interval in the office.          Discharge Instructions  Discharge Instructions    Diet - low sodium heart healthy   Complete by: As directed    Discharge instructions   Complete by: As directed    You were diagnosed with "arthritis of the spine" which is causing your back pain.  Dr. Izora Ribas (the spine surgeon) would like you to continue physical therapy at home and follow up with him in his office as directed  Take acetaminophen 1000 mg (two extra strnegth  tabs) up to three times daily for pain control or use a hot pad and stretches   Increase activity slowly   Complete by: As directed      Allergies as of 12/25/2018   No Known Allergies     Medication List    TAKE these medications   Alphagan P 0.1 % Soln Generic drug: brimonidine Apply 1 drop to eye 2 (two) times daily. Notes to patient: Not given during this hospitalization   aspirin 81 MG chewable tablet Chew 81 mg by mouth daily.   fluticasone 50 MCG/ACT nasal spray Commonly known as: FLONASE Place 2 sprays into both nostrils daily. Notes to patient: Not given during this hospitalization   latanoprost 0.005 % ophthalmic solution Commonly known as: XALATAN Place 1 drop into both eyes at bedtime. Notes to patient: Not given during this hospitalization   lisinopril-hydrochlorothiazide 10-12.5 MG tablet Commonly known as: ZESTORETIC Take 1 tablet by mouth daily. Notes to patient: Not given during this hospitalization   rosuvastatin 10 MG tablet Commonly known as: CRESTOR Take 10 mg by mouth daily.      Follow-up Information    Marin Olp, PA-C. Schedule an appointment as soon as possible for a visit on 01/01/2019.   Specialty: Neurosurgery Why: Nurse will call with appointment Contact information: [place] [address] [phone]          No Known Allergies  Consultations:  Neurosurgery   Procedures/Studies: Ct Lumbar Spine Wo Contrast  Result Date: 12/24/2018  CLINICAL DATA:  Lower back pain, worse on the right, recent MRI had limited evaluation EXAM: CT LUMBAR SPINE WITHOUT CONTRAST TECHNIQUE: Multidetector CT imaging of the lumbar spine was performed without intravenous contrast administration. Multiplanar CT image reconstructions were also generated. COMPARISON:  None. FINDINGS: Segmentation: There are 5 non-rib bearing lumbar type vertebral bodies with the last intervertebral disc space labeled as L5-S1. Alignment: There is a  dextroconvex scoliotic curvature of the lumbar spine. There is posterior XLif fixation is seen at L3-L4 and L4-L5. Vertebrae: The vertebral body heights are well maintained. No fracture, malalignment, or pathologic osseous lesions seen. Paraspinal and other soft tissues: The paraspinal soft tissues and visualized retroperitoneal structures are unremarkable. Scattered aortic atherosclerosis noted. The sacroiliac joints are intact. Disc levels: T12-L1:  No significant canal or neural foraminal narrowing. L1-L2: There is facet arthrosis which causes severe left and moderate right neural foraminal narrowing. There is also moderate central canal stenosis. L2-L3: Facet arthrosis causes severe left and moderate right neural foraminal narrowing. There is severe central canal stenosis central canal stenosis. L3-L4: There is facet arthrosis which causes moderate bilateral neural foraminal narrowing. There is also severe central canal stenosis, somewhat limited due to overlying streak artifact. L4-L5: There is facet arthrosis which causes severe bilateral neural foraminal narrowing. There also appears to be severe central canal stenosis, somewhat limited due to overlying streak artifact. L5-S1: There is facet arthrosis which causes severe left and moderate right neural foraminal narrowing. IMPRESSION: 1. No acute fracture or malalignment. 2. Dextroconvex scoliotic curvature of the lumbar spine. 3. Posterior XLIF  at L3-L4 and L4-L5. 4. Lumbar spine spondylosis most notable likely at L3-L4 and L4-L5 although somewhat limited due to overlying streak artifact and technique. Electronically Signed   By: Prudencio Pair M.D.   On: 12/24/2018 20:10   Ct Pelvis Wo Contrast  Result Date: 12/24/2018 CLINICAL DATA:  Enlarged lymph nodes sacral pain EXAM: CT PELVIS WITHOUT CONTRAST TECHNIQUE: Multidetector CT imaging of the pelvis was performed following the standard protocol without intravenous contrast. COMPARISON:  None. FINDINGS:  Urinary Tract: The visualized distal ureters and bladder appear unremarkable. Bowel: No bowel wall thickening, distention or surrounding inflammation identified within the pelvis. Scattered colonic diverticula are noted. Vascular/Lymphatic: No enlarged pelvic lymph nodes identified. Scattered aortic atherosclerotic calcifications are seen without aneurysmal dilatation. Reproductive:   The prostate is mildly enlarged and heterogeneous. Other: Bilateral fat containing inguinal hernias are present. No adenopathy seen within the inguinal regions. Musculoskeletal: No fracture or dislocation is seen. There is diffuse osteopenia. There is moderate to advanced bilateral hip osteoarthritis with superior subchondral cystic changes. There is mild fatty atrophy noted within the muscles surrounding the pelvis. No focal soft tissue swelling or fluid collection. IMPRESSION: 1. No acute fracture or malalignment of the pelvis. 2. Diffuse osteopenia. 3. Moderate to advanced bilateral hip osteoarthritis. 4.  Aortic Atherosclerosis (ICD10-I70.0). 5. Diverticulosis. Electronically Signed   By: Prudencio Pair M.D.   On: 12/24/2018 20:13   Mr Lumbar Spine Wo Contrast  Result Date: 12/24/2018 CLINICAL DATA:  Low back pain.  Right-sided pain with right leg pain EXAM: MRI LUMBAR SPINE AND SACRUM WITHOUT CONTRAST TECHNIQUE: Multiplanar, multisequence MR imaging of the lumbar spine was performed. No intravenous contrast was administered. COMPARISON:  None. FINDINGS: Segmentation: Exam quality is limited by extensive motion. Multiple repeat sequences however the patient continued to move throughout the study. Normal segmentation. Alignment:  Mild anterolisthesis L3-4.  Dextroscoliosis. Vertebrae: No vertebral fracture. There is hardware related artifact in the posterior  elements at L4-5 likely inter spinous spacer. No prior radiograph for correlation. Hemangioma T12 vertebral body. Images of the sacrum were attempted however are greatly  degraded by motion. There is also artifact posteriorly at L4-5 due to surgical hardware. No gross fracture or mass in the sacrum is identified. Conus medullaris and cauda equina: Conus not well visualized due to extensive motion. The distal cord and conus are grossly normal. Paraspinal and other soft tissues: No paraspinous mass or fluid collection. Disc levels: L1-2: Mild spinal stenosis. Disc degeneration and facet degeneration contributes to stenosis L2-3: Moderate to severe spinal stenosis due disc degeneration and endplate spurring as well as diffuse facet hypertrophy. Moderate subarticular and foraminal stenosis bilaterally L3-4: Severe spinal stenosis. Disc degeneration and diffuse endplate spurring. Bilateral facet hypertrophy. Moderate to severe subarticular and foraminal encroachment bilaterally L4-5: Moderate disc degeneration and spurring. Mild spinal stenosis. Bilateral facet degeneration with moderate subarticular stenosis bilaterally. Posterior element hardware related artifact likely inter spinous spacer. L5-S1: Disc degeneration and spondylosis. Moderate subarticular stenosis bilaterally left greater than right with impingement of the S1 nerve roots specially on the left. IMPRESSION: This study is markedly degraded by motion on all sequences. The patient does have multiple areas of significant spinal and foraminal stenosis throughout the lumbar spine which may account for the patient's symptoms. No fracture or mass lesion identified. Additional imaging is likely to be useful in confirming these findings. CT lumbar spine could be performed. Pain management is recommended prior to further imaging. Electronically Signed   By: Franchot Gallo M.D.   On: 12/24/2018 17:27   Mr Sacrum Si Joints Wo Contrast  Result Date: 12/24/2018 CLINICAL DATA:  Low back pain.  Right-sided pain with right leg pain EXAM: MRI LUMBAR SPINE AND SACRUM WITHOUT CONTRAST TECHNIQUE: Multiplanar, multisequence MR imaging of the  lumbar spine was performed. No intravenous contrast was administered. COMPARISON:  None. FINDINGS: Segmentation: Exam quality is limited by extensive motion. Multiple repeat sequences however the patient continued to move throughout the study. Normal segmentation. Alignment:  Mild anterolisthesis L3-4.  Dextroscoliosis. Vertebrae: No vertebral fracture. There is hardware related artifact in the posterior elements at L4-5 likely inter spinous spacer. No prior radiograph for correlation. Hemangioma T12 vertebral body. Images of the sacrum were attempted however are greatly degraded by motion. There is also artifact posteriorly at L4-5 due to surgical hardware. No gross fracture or mass in the sacrum is identified. Conus medullaris and cauda equina: Conus not well visualized due to extensive motion. The distal cord and conus are grossly normal. Paraspinal and other soft tissues: No paraspinous mass or fluid collection. Disc levels: L1-2: Mild spinal stenosis. Disc degeneration and facet degeneration contributes to stenosis L2-3: Moderate to severe spinal stenosis due disc degeneration and endplate spurring as well as diffuse facet hypertrophy. Moderate subarticular and foraminal stenosis bilaterally L3-4: Severe spinal stenosis. Disc degeneration and diffuse endplate spurring. Bilateral facet hypertrophy. Moderate to severe subarticular and foraminal encroachment bilaterally L4-5: Moderate disc degeneration and spurring. Mild spinal stenosis. Bilateral facet degeneration with moderate subarticular stenosis bilaterally. Posterior element hardware related artifact likely inter spinous spacer. L5-S1: Disc degeneration and spondylosis. Moderate subarticular stenosis bilaterally left greater than right with impingement of the S1 nerve roots specially on the left. IMPRESSION: This study is markedly degraded by motion on all sequences. The patient does have multiple areas of significant spinal and foraminal stenosis  throughout the lumbar spine which may account for the patient's symptoms. No fracture or mass lesion identified. Additional imaging is likely  to be useful in confirming these findings. CT lumbar spine could be performed. Pain management is recommended prior to further imaging. Electronically Signed   By: Franchot Gallo M.D.   On: 12/24/2018 17:27   US Venous Img Lower Unilateral Left (dvt)  Result Date: 12/25/2018 CLINICAL DATA:  Left leg swelling EXAM: LEFT LOWER EXTREMITY VENOUS DOPPLER ULTRASOUND TECHNIQUE: Gray-scale sonography with graded compression, as well as color Doppler and duplex ultrasound were performed to evaluate the lower extremity deep venous systems from the level of the common femoral vein and including the common femoral, femoral, profunda femoral, popliteal and calf veins including the posterior tibial, peroneal and gastrocnemius veins when visible. The superficial great saphenous vein was also interrogated. Spectral Doppler was utilized to evaluate flow at rest and with distal augmentation maneuvers in the common femoral, femoral and popliteal veins. COMPARISON:  None. FINDINGS: Contralateral Common Femoral Vein: Respiratory phasicity is normal and symmetric with the symptomatic side. No evidence of thrombus. Normal compressibility. Common Femoral Vein: No evidence of thrombus. Normal compressibility, respiratory phasicity and response to augmentation. Saphenofemoral Junction: No evidence of thrombus. Normal compressibility and flow on color Doppler imaging. Profunda Femoral Vein: No evidence of thrombus. Normal compressibility and flow on color Doppler imaging. Femoral Vein: No evidence of thrombus. Normal compressibility, respiratory phasicity and response to augmentation. Popliteal Vein: No evidence of thrombus. Normal compressibility, respiratory phasicity and response to augmentation. Calf Veins: No evidence of thrombus. Normal compressibility and flow on color Doppler imaging.  Superficial Great Saphenous Vein: No evidence of thrombus. Normal compressibility. Venous Reflux:  None. Other Findings:  None. IMPRESSION: No evidence of deep venous thrombosis. Electronically Signed   By: Rolm Baptise M.D.   On: 12/25/2018 02:02       Subjective: Feels better.  No numbness or weakness of the legs.  No fever, confusion.  Discharge Exam: Vitals:   12/25/18 0647 12/25/18 0837  BP: (!) 165/90 (!) 154/97  Pulse: 84 87  Resp:    Temp: 98.4 F (36.9 C) 97.9 F (36.6 C)  SpO2: 96% 99%   Vitals:   12/24/18 2214 12/25/18 0457 12/25/18 0647 12/25/18 0837  BP: 136/79 138/67 (!) 165/90 (!) 154/97  Pulse: 90 78 84 87  Resp: (!) 22 18    Temp: 98.4 F (36.9 C) 98.4 F (36.9 C) 98.4 F (36.9 C) 97.9 F (36.6 C)  TempSrc: Oral Oral Oral Oral  SpO2: 94% 95% 96% 99%  Weight:   88.4 kg   Height:   5\' 8"  (1.727 m)     General: Pt is alert, awake, not in acute distress Cardiovascular: RRR, nl S1-S2, no murmurs appreciated.   No LE edema.   Respiratory: Normal respiratory rate and rhythm.  CTAB without rales or wheezes. Abdominal: Abdomen soft and non-tender.  No distension or HSM.   Neuro/Psych: Strength symmetric in upper extremities, leg strength decreased generally but no loss of sensation.  Judgment and insight appear norma.   The results of significant diagnostics from this hospitalization (including imaging, microbiology, ancillary and laboratory) are listed below for reference.     Microbiology: Recent Results (from the past 240 hour(s))  SARS CORONAVIRUS 2 (TAT 6-24 HRS) Nasopharyngeal Nasopharyngeal Swab     Status: None   Collection Time: 12/24/18  9:01 PM   Specimen: Nasopharyngeal Swab  Result Value Ref Range Status   SARS Coronavirus 2 NEGATIVE NEGATIVE Final    Comment: (NOTE) SARS-CoV-2 target nucleic acids are NOT DETECTED. The SARS-CoV-2 RNA is generally detectable in  upper and lower respiratory specimens during the acute phase of infection.  Negative results do not preclude SARS-CoV-2 infection, do not rule out co-infections with other pathogens, and should not be used as the sole basis for treatment or other patient management decisions. Negative results must be combined with clinical observations, patient history, and epidemiological information. The expected result is Negative. Fact Sheet for Patients: SugarRoll.be Fact Sheet for Healthcare Providers: https://www.woods-mathews.com/ This test is not yet approved or cleared by the [place] FDA and  has been authorized for detection and/or diagnosis of SARS-CoV-2 by FDA under an Emergency Use Authorization (EUA). This EUA will remain  in effect (meaning this test can be used) for the duration of the COVID-19 declaration under Section 56 4(b)(1) of the Act, 21 U.S.C. section 360bbb-3(b)(1), unless the authorization is terminated or revoked sooner. Performed at [facility], [place] [address] [place], [place] [address]      Labs: BNP (last 3 results) No results for input(s): BNP in the last 8760 hours. Basic Metabolic Panel: Recent Labs  Lab 12/24/18 1600 12/25/18 0821  NA 132* 134*  K 3.9 3.8  CL 99 101  CO2 21* 24  GLUCOSE 110* 111*  BUN 21 18  CREATININE 1.00 1.04  CALCIUM 8.9 9.0  MG  --  2.0  PHOS  --  2.9   Liver Function Tests: Recent Labs  Lab 12/24/18 1600 12/25/18 0821  AST 23 29  ALT 19 23  ALKPHOS 44 45  BILITOT 1.2 1.1  PROT 7.1 7.6  ALBUMIN 3.8 4.0   No results for input(s): LIPASE, AMYLASE in the last 168 hours. No results for input(s): AMMONIA in the last 168 hours. CBC: Recent Labs  Lab 12/24/18 1600 12/25/18 0821  WBC 7.4 10.3  NEUTROABS 5.2  --   HGB 14.0 14.4  HCT 40.3 42.8  MCV 87.8 91.3  PLT 182 199   Cardiac Enzymes: No results for input(s): CKTOTAL, CKMB, CKMBINDEX, TROPONINI in the last 168 hours. BNP: Invalid input(s): POCBNP CBG: No results for input(s):  GLUCAP in the last 168 hours. D-Dimer No results for input(s): DDIMER in the last 72 hours. Hgb A1c No results for input(s): HGBA1C in the last 72 hours. Lipid Profile No results for input(s): CHOL, HDL, LDLCALC, TRIG, CHOLHDL, LDLDIRECT in the last 72 hours. Thyroid function studies Recent Labs    12/25/18 0821  TSH 1.021   Anemia work up No results for input(s): VITAMINB12, FOLATE, FERRITIN, TIBC, IRON, RETICCTPCT in the last 72 hours. Urinalysis No results found for: COLORURINE, APPEARANCEUR, [place], [place], [place], [place], [place], [place], PROTEINUR, UROBILINOGEN, NITRITE, LEUKOCYTESUR Sepsis Labs Invalid input(s): PROCALCITONIN,  WBC,  LACTICIDVEN Microbiology Recent Results (from the past 240 hour(s))  SARS CORONAVIRUS 2 (TAT 6-24 HRS) Nasopharyngeal Nasopharyngeal Swab     Status: None   Collection Time: 12/24/18  9:01 PM   Specimen: Nasopharyngeal Swab  Result Value Ref Range Status   SARS Coronavirus 2 NEGATIVE NEGATIVE Final    Comment: (NOTE) SARS-CoV-2 target nucleic acids are NOT DETECTED. The SARS-CoV-2 RNA is generally detectable in upper and lower respiratory specimens during the acute phase of infection. Negative results do not preclude SARS-CoV-2 infection, do not rule out co-infections with other pathogens, and should not be used as the sole basis for treatment or other patient management decisions. Negative results must be combined with clinical observations, patient history, and epidemiological information. The expected result is Negative. Fact Sheet for Patients: SugarRoll.be Fact Sheet for Healthcare Providers: https://www.woods-mathews.com/ This  test is not yet approved or cleared by the [place] and  has been authorized for detection and/or diagnosis of SARS-CoV-2 by FDA under an Emergency Use Authorization (EUA). This EUA will remain  in effect (meaning this test can be used) for the  duration of the COVID-19 declaration under Section 56 4(b)(1) of the Act, 21 U.S.C. section 360bbb-3(b)(1), unless the authorization is terminated or revoked sooner. Performed at [facility], [place] [address] [place], [place] [address]      Time coordinating discharge: 25 minutes      SIGNED:   Edwin Dada, MD  [facility] 12/25/2018, 4:29 PM

## 2020-03-18 ENCOUNTER — Ambulatory Visit: Payer: Medicare Other | Admitting: Nurse Practitioner

## 2020-03-18 ENCOUNTER — Encounter: Payer: Self-pay | Admitting: Nurse Practitioner

## 2020-03-18 ENCOUNTER — Other Ambulatory Visit: Payer: Self-pay

## 2020-03-18 VITALS — BP 120/80 | HR 100 | Temp 98.2°F | Ht 68.0 in

## 2020-03-18 DIAGNOSIS — L089 Local infection of the skin and subcutaneous tissue, unspecified: Secondary | ICD-10-CM | POA: Diagnosis not present

## 2020-03-18 DIAGNOSIS — T148XXA Other injury of unspecified body region, initial encounter: Secondary | ICD-10-CM

## 2020-03-18 MED ORDER — DOXYCYCLINE HYCLATE 100 MG PO TABS: 100.0000 mg | ORAL_TABLET | Freq: Two times a day (BID) | ORAL | 0 refills | Status: AC

## 2020-03-18 NOTE — Progress Notes (Signed)
Careteam: Patient Care Team: Derinda Late, MD as PCP - General (Family Medicine)  Advanced Directive information    No Known Allergies  Chief Complaint  Patient presents with  . Acute Visit    Check wound of right elbow. Patient had fall Monday night. Wound is red and yellow. Patient's wife states it seems to be looking worse. Patient has nothing to put on wound except sterile pads.     HPI: Patient is a 87 y.o. male seen in today at the [facility] for evaluation of abrasion.  He had a fall 3 days ago and scraped up his elbow and hit his knee which just caused bruising.  Wife has been monitoring elbow abrasion and it has gotten more red and draining pus today so she wanted it evaluated.  Has been putting saline on area and covering with nonstick dressing.  He has also been complaining of more pain to area.  No fevers or chills.  No increase in fatigue or confusion.    Review of Systems:  Review of Systems  Constitutional: Negative for chills, fever and weight loss.  Skin:       Painful wound to right elbow.     Past Medical History:  Diagnosis Date  . Hypercholesteremia   . Hypertension    No past surgical history on file. Social History:   reports that he has never smoked. He has never used smokeless tobacco. He reports that he does not drink alcohol. No history on file for drug use.  Family History  Problem Relation Age of Onset  . Cancer Mother   . Bone cancer Mother   . Prostate cancer Father     Medications: Patient's Medications  New Prescriptions   No medications on file  Previous Medications   ALPHAGAN P 0.1 % SOLN    Apply 1 drop to eye 2 (two) times daily.   ASPIRIN 81 MG CHEWABLE TABLET    Chew 81 mg by mouth daily.   FLUTICASONE (FLONASE) 50 MCG/ACT NASAL SPRAY    Place 2 sprays into both nostrils daily.   LATANOPROST (XALATAN) 0.005 % OPHTHALMIC SOLUTION    Place 1 drop into both eyes at bedtime.   LISINOPRIL-HYDROCHLOROTHIAZIDE  (ZESTORETIC) 10-12.5 MG TABLET    Take 1 tablet by mouth daily.   ROSUVASTATIN (CRESTOR) 10 MG TABLET    Take 10 mg by mouth daily.  Modified Medications   No medications on file  Discontinued Medications   No medications on file    Physical Exam:  Vitals:   03/18/20 1615  BP: 120/80  Pulse: 100  Temp: 98.2 F (36.8 C)  SpO2: 97%  Height: 5\' 8"  (1.727 m)   Body mass index is 29.62 kg/m. Wt Readings from Last 3 Encounters:  12/25/18 194 lb 12.8 oz (88.4 kg)    Physical Exam Constitutional:      Appearance: Normal appearance.  Skin:    Findings: Erythema present.     Comments: Abrasion noted to right elbow, in the shape of a Y 3 cm in length and 0.3 cm across  Redness surrounding area  Yellow base noted.   Neurological:     Mental Status: He is alert.     Labs reviewed: Basic Metabolic Panel: No results for input(s): NA, K, CL, CO2, GLUCOSE, BUN, CREATININE, CALCIUM, MG, PHOS, TSH in the last 8760 hours. Liver Function Tests: No results for input(s): AST, ALT, ALKPHOS, BILITOT, PROT, ALBUMIN in the last 8760 hours. No results for  input(s): LIPASE, AMYLASE in the last 8760 hours. No results for input(s): AMMONIA in the last 8760 hours. CBC: No results for input(s): WBC, NEUTROABS, HGB, HCT, MCV, PLT in the last 8760 hours. Lipid Panel: No results for input(s): CHOL, HDL, LDLCALC, TRIG, CHOLHDL, LDLDIRECT in the last 8760 hours. TSH: No results for input(s): TSH in the last 8760 hours. A1C: No results found for: HGBA1C   Assessment/Plan 1. Infected abrasion -area with redness and purulent drainage per wife, none seen at time of visit. However tender and since area is worsening in appearance with reported drainage; will treat with oral antibiotics.  To take probiotic with antibiotic (to help prevent upset stomach) To monitor for worsening drainage, pain, or fevers -applied xeroform dressing, to change every 3 days or sooner if needed, will follow up with [place]  nurse for ongoing monitoring.  - doxycycline (VIBRA-TABS) 100 MG tablet; Take 1 tablet (100 mg total) by mouth 2 (two) times daily.  Dispense: 14 tablet; Refill: 0  Cornelius Marullo K. Ong, [facility] [phone]

## 2020-03-18 NOTE — Patient Instructions (Signed)
To take doxycyline 100 mg by mouth twice daily with food - take antibiotic until complete.  To take probiotic with antibiotic (to help prevent upset stomach)  To monitor for worsening drainage, pain, or fevers  Follow up with [facility] Monday afternoon

## 2020-04-30 ENCOUNTER — Telehealth: Payer: Self-pay | Admitting: Adult Health Nurse Practitioner

## 2020-04-30 NOTE — Telephone Encounter (Signed)
Called patient and spoke with son, Zachary Nichols., regarding the Palliative referral/services and son stated that he was with the patient at the MD appointment with his Mom and they were aware of the referral.  All questions were answered and he was in agreement with scheduling visit.  I have scheduled an In-home Consult for 05/13/20 @ 1 PM, son requested a sooner appointment if possible and I told him that I would try and see if we could get this done.

## 2020-04-30 NOTE — Telephone Encounter (Signed)
Spoke with patient's wife, Roland Rack, and told her that Palliative NP could see patient sooner, on 05/03/20 @ 3 PM and she was in agreement with this.

## 2020-05-03 ENCOUNTER — Other Ambulatory Visit: Payer: Self-pay

## 2020-05-03 ENCOUNTER — Other Ambulatory Visit: Payer: Medicare Other | Admitting: Adult Health Nurse Practitioner

## 2020-05-03 DIAGNOSIS — Z515 Encounter for palliative care: Secondary | ICD-10-CM

## 2020-05-03 DIAGNOSIS — F0280 Dementia in other diseases classified elsewhere without behavioral disturbance: Secondary | ICD-10-CM

## 2020-05-03 DIAGNOSIS — R296 Repeated falls: Secondary | ICD-10-CM

## 2020-05-03 DIAGNOSIS — R5381 Other malaise: Secondary | ICD-10-CM

## 2020-05-03 NOTE — Progress Notes (Signed)
[facility] Consult Note Telephone: [phone]  Fax: [phone]    Date of encounter: 05/03/20 PATIENT NAME: Zachary Nichols [address] [place] [address]   [phone] (home)  DOB: 25-Jan-1928 MRN: [id number] PRIMARY CARE PROVIDER:    Derinda Late, MD,  [facility] [place] [id number] [phone]  REFERRING PROVIDER:   Derinda Late, MD [phone] [address] [facility] [address],  [place] [address] [phone]  RESPONSIBLE PARTY:    Contact Information    Name Relation Home Work Mobile   [place] Spouse [phone]  [phone]   [place], Vuncannon   [phone]   [place], Speyer   [phone]       I met face to face with patient and family in the home. Palliative Care was asked to follow this patient by consultation request of  Derinda Late, MD to address advance care planning and complex medical decision making. This is the initial visit.                                     ASSESSMENT AND PLAN / RECOMMENDATIONS:   Advance Care Planning/Goals of Care: Goals include to maximize quality of life and symptom management. Our advance care planning conversation included a discussion about:   Confirmed patient has DNR in the home and uploaded to [place]: DNR  Symptom Management/Plan:  Debility:  Patient is having decline related to his dementia.  They have attempted SNF placement.  Discussed ways to help control anxiety/agitation during the transition if family wishes to pursue SNF placement again in the future.  Discussed palliative versus hospice services.  Answered family's questions.  Will reach out to PCP with recommendation for hospital bed.  Frequent falls:  Discussed hospital bed as he does have falls getting trying to get into bed. May need bed rails or trapeze for bed mobility.  Discussed  getting more help in the home.  Family would like to try this.  They have been working with SW at [facility] and have been told that especially night time aides are not available right now.  Provided a list of in home care agencies. Will reach out to SW at [facility] with update on our visit and to see if there is anything she can help the family with.  Will reach out to hospice physicians for hospice eligibility.     Follow up Palliative Care Visit: Palliative care will continue to follow for complex medical decision making, advance care planning, and clarification of goals. Return 4 weeks or prn.  Encouraged to call with any questions or concerns  I spent 75 minutes providing this consultation. More than 50% of the time in this consultation was spent in counseling and care coordination.   PPS: 30%  HOSPICE ELIGIBILITY/DIAGNOSIS: yes/Alzheimer's  Chief Complaint: initial palliative visit/debility  HISTORY OF PRESENT ILLNESS:  Zachary Nichols is a 87 y.o. year old male  with Alzheimer's dementia, HTN, HLD, venous insufficiency, lumbar spinal stenosis, glaucoma. Patient lives with wife in [place] at [facility].  Over the past 1 month patient has gone from being able to stand and ambulate with a walker on his own and now requires assistance with standing and has to be pushed in seat of rollator or wheelchair.  He requires assistance with bathing and  dressing.  He is able to feed himself.  He is continent of B&B but has occasional urinary accidents.  Patient has had several falls over the past couple of months.  Son states that he has at least one fall a week.  He did have a fall yesterday and now has swelling and ecchymosis to right ankle.  They have been propping it up and using ice packs.  He states that it hurts a little bit.  He has been able to bear weight on it during transfers.  Wife does state the falls when getting into bed.   She has reached out to the nurse at [facility] and wife  plans on taking him to ER or [facility] for xrays.  Wife states that he has lost 50 pounds over the past year.  On 10/17/19 weighed 179 with BMI 29.79.  04/29/20 weighed 164 with BMI 27.29.  That is 8% weight loss in about 5 months.  Wife does state that he is eating less over the past month.  He eats about 25% of what he used to.  No wounds.  He has not been sleeping well and has been started on Seroquel 25 mg QHS about 4 days ago.  Wife states that at first it wasn't helping but states that last night was the best night sleep they have had in a while. Rest of 10 ROS asked and negative except what is in HPI. Family has tried to move him to skilled nursing but he became so agitated that they brought him back home.   History obtained from review of EMR and interview with family and/or Mr. Milone. Wife present during visit and sons present via phone. I reviewed available labs, medications, imaging, studies and related documents from the EMR.  Records reviewed and summarized above.    Physical Exam: BP 128/66  HR 88  O2 94% on RA Constitutional: NAD General: frail appearing, WNWD EYES: anicteric sclera, lids intact, no discharge  ENMT: intact hearing, oral mucous membranes moist, dentition intact CV: S1S2, RRR Pulmonary: LCTA, no increased work of breathing, no cough Abdomen: normo-active BS + 4 quadrants, soft and non tender, no ascites MSK: swelling and ecchymosis to right ankle post fall Skin: warm and dry, no rashes on visible skin Neuro:  A&O to person and place Hem/lymph/immuno: no widespread bruising   CURRENT PROBLEM LIST:  Patient Active Problem List   Diagnosis Date Noted  . Essential hypertension 12/25/2018  . Hyperlipidemia 12/25/2018  . Leg edema, left 12/25/2018  . Back pain 12/24/2018   PAST MEDICAL HISTORY:  Active Ambulatory Problems    Diagnosis Date Noted  . Back pain 12/24/2018  . Essential hypertension 12/25/2018  . Hyperlipidemia 12/25/2018  . Leg edema, left  12/25/2018   Resolved Ambulatory Problems    Diagnosis Date Noted  . No Resolved Ambulatory Problems   Past Medical History:  Diagnosis Date  . Hypercholesteremia   . Hypertension    SOCIAL HX:  Social History   Tobacco Use  . Smoking status: Never Smoker  . Smokeless tobacco: Never Used  Substance Use Topics  . Alcohol use: Never   FAMILY HX:  Family History  Problem Relation Age of Onset  . Cancer Mother   . Bone cancer Mother   . Prostate cancer Father       ALLERGIES: No Known Allergies   PERTINENT MEDICATIONS:  Outpatient Encounter Medications as of 05/03/2020  Medication Sig  . ALPHAGAN P 0.1 % SOLN Apply 1  drop to eye 2 (two) times daily.  Marland Kitchen aspirin 81 MG chewable tablet Chew 81 mg by mouth daily.  Marland Kitchen doxycycline (VIBRA-TABS) 100 MG tablet Take 1 tablet (100 mg total) by mouth 2 (two) times daily.  . fluticasone (FLONASE) 50 MCG/ACT nasal spray Place 2 sprays into both nostrils daily.  Marland Kitchen latanoprost (XALATAN) 0.005 % ophthalmic solution Place 1 drop into both eyes at bedtime.  Marland Kitchen lisinopril-hydrochlorothiazide (ZESTORETIC) 10-12.5 MG tablet Take 1 tablet by mouth daily.  . rosuvastatin (CRESTOR) 10 MG tablet Take 10 mg by mouth daily.   No facility-administered encounter medications on file as of 05/03/2020.    Thank you for the opportunity to participate in the care of Mr. Regina.  The palliative care team will continue to follow. Please call our office at [phone] if we can be of additional assistance.    Jenetta Downer, NP , DNP, AGPCNP-BC  COVID-19 PATIENT SCREENING TOOL Asked and negative response unless otherwise noted:   Have you had symptoms of covid, tested positive or been in contact with someone with symptoms/positive test in the past 5-10 days? negative

## 2020-05-04 ENCOUNTER — Telehealth: Payer: Self-pay | Admitting: Adult Health Nurse Practitioner

## 2020-05-04 DIAGNOSIS — N400 Enlarged prostate without lower urinary tract symptoms: Secondary | ICD-10-CM

## 2020-05-04 DIAGNOSIS — M84471A Pathological fracture, right ankle, initial encounter for fracture: Secondary | ICD-10-CM | POA: Diagnosis not present

## 2020-05-04 DIAGNOSIS — E441 Mild protein-calorie malnutrition: Secondary | ICD-10-CM

## 2020-05-04 DIAGNOSIS — F39 Unspecified mood [affective] disorder: Secondary | ICD-10-CM | POA: Diagnosis not present

## 2020-05-04 DIAGNOSIS — M48061 Spinal stenosis, lumbar region without neurogenic claudication: Secondary | ICD-10-CM

## 2020-05-04 DIAGNOSIS — G301 Alzheimer's disease with late onset: Secondary | ICD-10-CM | POA: Diagnosis not present

## 2020-05-04 DIAGNOSIS — I10 Essential (primary) hypertension: Secondary | ICD-10-CM | POA: Diagnosis not present

## 2020-05-04 NOTE — Telephone Encounter (Signed)
Spoke with wife about husband's hospice eligibility.  She would like to proceed with hospice.  Reached out to PCP with request for hospice referral and if he would continue to be attending. Also reached out to SW at [facility] to discuss update on our visit yesterday. Hanna Ra K. Olena Heckle NP

## 2020-05-05 ENCOUNTER — Encounter: Payer: Self-pay | Admitting: Adult Health Nurse Practitioner

## 2020-05-05 ENCOUNTER — Other Ambulatory Visit: Payer: Self-pay

## 2020-05-05 ENCOUNTER — Non-Acute Institutional Stay: Payer: Medicare Other | Admitting: Adult Health Nurse Practitioner

## 2020-05-05 DIAGNOSIS — R296 Repeated falls: Secondary | ICD-10-CM

## 2020-05-05 DIAGNOSIS — F0280 Dementia in other diseases classified elsewhere without behavioral disturbance: Secondary | ICD-10-CM

## 2020-05-05 DIAGNOSIS — F03911 Unspecified dementia, unspecified severity, with agitation: Secondary | ICD-10-CM

## 2020-05-05 DIAGNOSIS — Z515 Encounter for palliative care: Secondary | ICD-10-CM

## 2020-05-05 DIAGNOSIS — F0391 Unspecified dementia with behavioral disturbance: Secondary | ICD-10-CM

## 2020-05-05 NOTE — Progress Notes (Signed)
[facility] Consult Note Telephone: [phone]  Fax: [phone]    Date of encounter: 05/05/20 PATIENT NAME: Zachary Nichols [address] [place] [address]   [phone] (home)  DOB: 04-09-1928 MRN: [id number] PRIMARY CARE PROVIDER:    Dr. Colin Broach PROVIDER:   Dr. Silvio Pate  RESPONSIBLE PARTY:    Contact Information    Name Relation Home Work Mobile   [place] Spouse [phone]  [phone]   [place], Halbert   [phone]   [place], Noyce   [phone]       I met face to face with patient in facility. Palliative Care was asked to follow this patient by consultation request of  Derinda Late, MD to address advance care planning and complex medical decision making. This is a follow up visit.                                   ASSESSMENT AND PLAN / RECOMMENDATIONS:   Advance Care Planning/Goals of Care: Goals include to maximize quality of life and symptom management. Will reach out to wife with update on today's visit  CODE STATUS: DNR  Symptom Management/Plan:  Agitation: Patient having increased agitation with change in environment.  Was started on sertraline, memantine, and Ativan as needed.  These were just started and not able to see full therapeutic effects at this time.  We will continue to assess.  Frequent falls: Patient has right ankle fracture related to fall 3 days ago.  He is now receiving short-term rehab at facility.  Continue PT as ordered and supportive care at the facility   Follow up Palliative Care Visit: Palliative care will continue to follow for complex medical decision making, advance care planning, and clarification of goals. Return 2-4 weeks or prn.  I spent 35 minutes providing this consultation. More than 50% of the time in this consultation was spent in counseling and care coordination.   PPS: 30%  HOSPICE ELIGIBILITY/DIAGNOSIS: TBD  Chief Complaint: follow  up palliative visit/agitation  HISTORY OF PRESENT ILLNESS:  Zachary Nichols is a 87 y.o. year old male  with Alzheimer's dementia, HTN, HLD, venous insufficiency, lumbar spinal stenosis, glaucoma.  Patient was resident of independent living with his wife.  After right ankle fracture wife was unable to adequately take care of him by herself at home.  Patient has had transfer to SNF for short-term rehab and transfer to long-term care resident.  Patient unable to contribute to HPI/ROS secondary to dementia.  Staff reports that he has been highly agitated and has been calling out for help and assistance.  Stated that last night they ended up having to call his wife at 3 AM in the morning to help calm him down.  While at facility today patient is receiving his first dose of as needed Ativan for anxiety/agitation.  Staff reports that today he has not eaten breakfast or lunch.  Staff reports that he frequently calls out to go to the bathroom and staff has taken him 4 times in the past hour and he has had very little output.  They are trying to keep him close to the nurses station as he did have a fall about 30 minutes prior to my visit today.  He was found on the bathroom floor with no injuries noted.  History obtained from review of EMR, discussion with primary team, and interview with family, facility staff/caregiver and/or Zachary Nichols.  I reviewed available labs, medications, imaging, studies and related documents from the EMR.  Records reviewed and summarized above.   Physical Exam:  Constitutional: NAD General: frail appearing EYES: anicteric sclera, lids intact, no discharge  ENMT: intact hearing, oral mucous membranes moist Pulmonary:  no increased work of breathing, no cough MSK: patient is nonambulatory; has boot to right leg for right ankle fracture Skin: warm and dry, no rashes or wounds on visible skin Psych: patient agitated and frequently calling out for assistance to go to the bathroom or to go  back to his room Hem/lymph/immuno: no widespread bruising   Thank you for the opportunity to participate in the care of Zachary Nichols.  The palliative care team will continue to follow. Please call our office at [phone] if we can be of additional assistance.   Zandyr Barnhill Jenetta Downer, NP , DNP  COVID-19 PATIENT SCREENING TOOL Asked and negative response unless otherwise noted:   Have you had symptoms of covid, tested positive or been in contact with someone with symptoms/positive test in the past 5-10 days? negative

## 2020-05-07 ENCOUNTER — Telehealth: Payer: Self-pay | Admitting: Adult Health Nurse Practitioner

## 2020-05-07 NOTE — Telephone Encounter (Signed)
Spoke with wife to update on visit with patient on Wednesday.  Feels like none of the medication is helping with his anxiety.  She did say Dr. Silvio Pate is going to see him on Monday and he has follow up appt with ortho on Monday as well.  Will check in with patient next week. Wife encouraged to call with any question or concerns. Zachary Nichols K. Olena Heckle NP

## 2020-05-13 ENCOUNTER — Other Ambulatory Visit: Payer: Medicare Other | Admitting: Adult Health Nurse Practitioner

## 2020-05-14 ENCOUNTER — Other Ambulatory Visit: Payer: Self-pay

## 2020-05-14 ENCOUNTER — Non-Acute Institutional Stay: Payer: Medicare Other | Admitting: Adult Health Nurse Practitioner

## 2020-05-14 ENCOUNTER — Encounter: Payer: Self-pay | Admitting: Adult Health Nurse Practitioner

## 2020-05-14 VITALS — HR 92 | Wt 169.4 lb

## 2020-05-14 DIAGNOSIS — F0391 Unspecified dementia with behavioral disturbance: Secondary | ICD-10-CM

## 2020-05-14 DIAGNOSIS — F39 Unspecified mood [affective] disorder: Secondary | ICD-10-CM | POA: Diagnosis not present

## 2020-05-14 DIAGNOSIS — Z515 Encounter for palliative care: Secondary | ICD-10-CM

## 2020-05-14 DIAGNOSIS — F03911 Unspecified dementia, unspecified severity, with agitation: Secondary | ICD-10-CM

## 2020-05-14 DIAGNOSIS — S93401D Sprain of unspecified ligament of right ankle, subsequent encounter: Secondary | ICD-10-CM

## 2020-05-14 DIAGNOSIS — F0280 Dementia in other diseases classified elsewhere without behavioral disturbance: Secondary | ICD-10-CM

## 2020-05-14 NOTE — Progress Notes (Signed)
[facility] Consult Note Telephone: [phone]  Fax: [phone]    Date of encounter: 05/14/20 PATIENT NAME: Zachary Nichols [address] [place] [address]   [phone] (home)  DOB: Jun 29, 1928 MRN: [id number] PRIMARY CARE PROVIDER:    Dr. Colin Broach PROVIDER:   Dr. Silvio Pate  RESPONSIBLE PARTY:    Contact Information    Name Relation Home Work Mobile   [place] Spouse [phone]  [phone]   [place], Bruhl   [phone]   [place], Mcgowan   [phone]       I met face to face with patient in facility. Palliative Care was asked to follow this patient by consultation request of Dr. Silvio Pate to address advance care planning and complex medical decision making. This is a follow up visit.                                   ASSESSMENT AND PLAN / RECOMMENDATIONS:   Advance Care Planning/Goals of Care: Goals include to maximize quality of life and symptom management.   Spoke with wife via phone to update on today's visit.  She did have questions about 24/7 care at home as opposed to keeping him in the facility.  Answered her questions the best I could.  CODE STATUS: DNR  Symptom Management/Plan:  Dementia/agitation: patient having some improvement with agitation.  He may have more improvement the longer he is on the sertraline.  Continue supportive care at facility  Right ankle sprain: Patient not complaining of any pain.  Does have Tylenol as needed if he does complain of pain.  Patient continues with PT at facility.  Staff does report that he has refused PT today.  This is the only time he has refused PT.  Continue therapy as ordered  Follow up Palliative Care Visit: Palliative care will continue to follow for complex medical decision making, advance care planning, and clarification of goals. Return 6-8 weeks or prn.  Wife encouraged to call with any questions or concerns  I spent 35 minutes  providing this consultation. More than 50% of the time in this consultation was spent in counseling and care coordination.   PPS: 30%  HOSPICE ELIGIBILITY/DIAGNOSIS: TBD  Chief Complaint: follow up palliative visit/agitation  HISTORY OF PRESENT ILLNESS:  Zachary Nichols is a 87 y.o. year old male  with Alzheimer's dementia, HTN, HLD, venous insufficiency, lumbar spinal stenosis, glaucoma.  Staff reports that patient will have good and bad days with his agitation.  Staff does report that the Ativan seems to be helping.  He continues with sertraline and Namenda.  I do state that he still is not eating well and that they will have to bargain with him to get him to eat.  Weight was 166.8 on 04/23/2020 and on 05/13/2020 was 169.4.  Patient's boot to right ankle has been taken off.  Staff reports that it is determined that he just has a severe sprain and not a fracture.  Patient denies pain.  Patient is more pleasant today when interacting with him and he has allowed me to do an assessment on him today. HPI/ROS unreliable secondary to dementia.    History obtained from review of EMR and interview with family, facility staff and Mr. Bayley.     Physical Exam:  Constitutional: NAD General: frail appearing EYES: anicteric sclera, lids intact, no discharge  ENMT: intact hearing, oral mucous membranes moist Cardiovascular:  Regular rate and rhythm; has edema noted to bilateral lower legs Pulmonary:  Lung sounds clear; no increased work of breathing, no cough MSK: patient is nonambulatory Skin: warm and dry, no rashes on visible skin Neuor:  A& O to person and place Hem/lymph/immuno: no widespread bruising  Thank you for the opportunity to participate in the care of Zachary Nichols.  The palliative care team will continue to follow. Please call our office at [phone] if we can be of additional assistance.   Sophonie Goforth Jenetta Downer, NP , DNP  This chart was dictated using voice recognition software. Despite best  efforts to proofread, errors can occur which can change the documentation meaning.   COVID-19 PATIENT SCREENING TOOL Asked and negative response unless otherwise noted:   Have you had symptoms of covid, tested positive or been in contact with someone with symptoms/positive test in the past 5-10 days? negative

## 2020-05-26 DIAGNOSIS — R609 Edema, unspecified: Secondary | ICD-10-CM | POA: Diagnosis not present

## 2020-05-26 DIAGNOSIS — R0989 Other specified symptoms and signs involving the circulatory and respiratory systems: Secondary | ICD-10-CM | POA: Diagnosis not present

## 2020-05-26 DIAGNOSIS — R011 Cardiac murmur, unspecified: Secondary | ICD-10-CM | POA: Diagnosis not present

## 2020-05-26 DIAGNOSIS — R1030 Lower abdominal pain, unspecified: Secondary | ICD-10-CM | POA: Diagnosis not present

## 2020-05-26 DIAGNOSIS — R8290 Unspecified abnormal findings in urine: Secondary | ICD-10-CM

## 2020-05-27 ENCOUNTER — Other Ambulatory Visit: Payer: Self-pay | Admitting: Internal Medicine

## 2020-05-27 DIAGNOSIS — J90 Pleural effusion, not elsewhere classified: Secondary | ICD-10-CM

## 2020-05-27 NOTE — Progress Notes (Signed)
Thanks for your help.

## 2020-05-27 NOTE — Progress Notes (Signed)
Will review tomorrow

## 2020-05-27 NOTE — Progress Notes (Signed)
[facility] resident now under my care (change me to PCP please).  See CXR report and normal renal function yesterday

## 2020-05-27 NOTE — Progress Notes (Signed)
Spoke with Buffy at [facility] due to transportation she asked for appointment on 05/28/20. Patient is scheduled for 05/28/20 at 9 am per Buffy they will have someone to come with patient to assist. Appointment is made for [place] [facility].

## 2020-05-28 ENCOUNTER — Ambulatory Visit
Admission: RE | Admit: 2020-05-28 | Discharge: 2020-05-28 | Disposition: A | Discharge status: Home / Self Care | Payer: Medicare Other | Source: Ambulatory Visit | Attending: Internal Medicine | Admitting: Internal Medicine

## 2020-05-28 ENCOUNTER — Other Ambulatory Visit: Payer: Self-pay

## 2020-05-28 DIAGNOSIS — J90 Pleural effusion, not elsewhere classified: Secondary | ICD-10-CM | POA: Insufficient documentation

## 2020-05-28 LAB — POCT I-STAT CREATININE: Creatinine, Ser: 0.8 mg/dL (ref 0.61–1.24)

## 2020-05-28 MED ORDER — IOHEXOL 300 MG/ML  SOLN
75.0000 mL | Freq: Once | INTRAMUSCULAR | Status: AC | PRN
  Administered 2020-05-28: 75 mL via INTRAVENOUS

## 2020-05-31 ENCOUNTER — Other Ambulatory Visit: Payer: Self-pay | Admitting: Internal Medicine

## 2020-05-31 DIAGNOSIS — D1430 Benign neoplasm of unspecified bronchus and lung: Secondary | ICD-10-CM | POA: Diagnosis not present

## 2020-05-31 DIAGNOSIS — J918 Pleural effusion in other conditions classified elsewhere: Secondary | ICD-10-CM | POA: Diagnosis not present

## 2020-05-31 DIAGNOSIS — R918 Other nonspecific abnormal finding of lung field: Secondary | ICD-10-CM

## 2020-06-02 ENCOUNTER — Telehealth: Payer: Self-pay | Admitting: Emergency Medicine

## 2020-06-04 ENCOUNTER — Other Ambulatory Visit: Payer: Self-pay | Admitting: Pulmonary Disease

## 2020-06-04 DIAGNOSIS — J90 Pleural effusion, not elsewhere classified: Secondary | ICD-10-CM

## 2020-06-04 NOTE — Telephone Encounter (Signed)
Patient already scheduled. Will close encounter.

## 2020-06-07 ENCOUNTER — Other Ambulatory Visit: Payer: Self-pay | Admitting: Radiology

## 2020-06-07 ENCOUNTER — Ambulatory Visit
Admission: RE | Admit: 2020-06-07 | Discharge: 2020-06-07 | Disposition: A | Discharge status: Home / Self Care | Payer: Medicare Other | Source: Ambulatory Visit | Attending: Pulmonary Disease | Admitting: Pulmonary Disease

## 2020-06-07 ENCOUNTER — Ambulatory Visit
Admission: RE | Admit: 2020-06-07 | Discharge: 2020-06-07 | Disposition: A | Discharge status: Home / Self Care | Payer: Medicare Other | Source: Ambulatory Visit | Attending: Radiology | Admitting: Radiology

## 2020-06-07 ENCOUNTER — Other Ambulatory Visit: Payer: Self-pay

## 2020-06-07 DIAGNOSIS — Z9889 Other specified postprocedural states: Secondary | ICD-10-CM

## 2020-06-07 DIAGNOSIS — J90 Pleural effusion, not elsewhere classified: Secondary | ICD-10-CM | POA: Diagnosis not present

## 2020-06-07 LAB — BODY FLUID CELL COUNT WITH DIFFERENTIAL
Eos, Fluid: 0 %
Lymphs, Fluid: 58 %
Monocyte-Macrophage-Serous Fluid: 32 %
Neutrophil Count, Fluid: 10 %
Total Nucleated Cell Count, Fluid: 45 cu mm

## 2020-06-07 LAB — PROTEIN, PLEURAL OR PERITONEAL FLUID: Total protein, fluid: <3 g/dL

## 2020-06-07 LAB — AMYLASE, PLEURAL OR PERITONEAL FLUID: Amylase, Fluid: 13 U/L

## 2020-06-07 LAB — LACTATE DEHYDROGENASE, PLEURAL OR PERITONEAL FLUID: LD, Fluid: 429 U/L — ABNORMAL HIGH (ref 3–23)

## 2020-06-07 LAB — GLUCOSE, PLEURAL OR PERITONEAL FLUID: Glucose, Fluid: <20 mg/dL

## 2020-06-07 NOTE — Procedures (Addendum)
Ultrasound-guided diagnostic and therapeutic left sided thoracentesis performed yielding 1 liters of amber colored fluid. No immediate complications.   Diagnostic fluid was sent to the lab for further analysis. Follow-up chest x-ray pending. EBL is < 2 ml. Patient unable to tolerate additional fluid removal at this time.

## 2020-06-08 LAB — PROTEIN, BODY FLUID (OTHER): Total Protein, Body Fluid Other: 2 g/dL

## 2020-06-08 LAB — CYTOLOGY - NON PAP

## 2020-06-08 LAB — ACID FAST SMEAR (AFB, MYCOBACTERIA): Acid Fast Smear: NEGATIVE

## 2020-06-09 ENCOUNTER — Institutional Professional Consult (permissible substitution): Payer: Medicare Other | Admitting: Emergency Medicine

## 2020-06-10 DIAGNOSIS — J9859 Other diseases of mediastinum, not elsewhere classified: Secondary | ICD-10-CM | POA: Diagnosis not present

## 2020-06-11 LAB — BODY FLUID CULTURE W GRAM STAIN: Culture: NO GROWTH

## 2020-06-13 LAB — CHOLESTEROL, BODY FLUID: Cholesterol, Fluid: 23 mg/dL

## 2020-06-25 DIAGNOSIS — J918 Pleural effusion in other conditions classified elsewhere: Secondary | ICD-10-CM | POA: Diagnosis not present

## 2020-06-25 DIAGNOSIS — R1904 Left lower quadrant abdominal swelling, mass and lump: Secondary | ICD-10-CM | POA: Diagnosis not present

## 2020-06-25 DIAGNOSIS — J9859 Other diseases of mediastinum, not elsewhere classified: Secondary | ICD-10-CM | POA: Diagnosis not present

## 2020-07-06 LAB — FUNGUS CULTURE WITH STAIN

## 2020-07-06 LAB — FUNGUS CULTURE RESULT

## 2020-07-06 LAB — FUNGAL ORGANISM REFLEX

## 2020-07-09 DIAGNOSIS — M4010 Other secondary kyphosis, site unspecified: Secondary | ICD-10-CM

## 2020-07-09 DIAGNOSIS — G309 Alzheimer's disease, unspecified: Secondary | ICD-10-CM | POA: Diagnosis not present

## 2020-07-09 DIAGNOSIS — J9859 Other diseases of mediastinum, not elsewhere classified: Secondary | ICD-10-CM

## 2020-07-09 DIAGNOSIS — I10 Essential (primary) hypertension: Secondary | ICD-10-CM | POA: Diagnosis not present

## 2020-07-09 DIAGNOSIS — E43 Unspecified severe protein-calorie malnutrition: Secondary | ICD-10-CM | POA: Diagnosis not present

## 2020-07-09 DIAGNOSIS — F39 Unspecified mood [affective] disorder: Secondary | ICD-10-CM | POA: Diagnosis not present

## 2020-07-25 LAB — ACID FAST CULTURE WITH REFLEXED SENSITIVITIES (MYCOBACTERIA): Acid Fast Culture: NEGATIVE

## 2022-02-03 IMAGING — CT CT CHEST W/ CM
2 of 4 series · 15 of 36 positions shown, 18 images · IV contrast (omnipaque)
Comparison: None.

CLINICAL DATA: Right lower chest discomfort for 3 weeks.

EXAM:
CT CHEST WITH CONTRAST
TECHNIQUE: Multidetector CT imaging of the chest was performed during
intravenous contrast administration.
CONTRAST:  75mL OMNIPAQUE IOHEXOL 300 MG/ML  SOLN

[Series 2: axial chest 2.00 · axial · 0.69mm/px · z∈[-1136,-818]mm · 12 of 189 slices shown, 15 images]
[im 15/189  mediastinal]
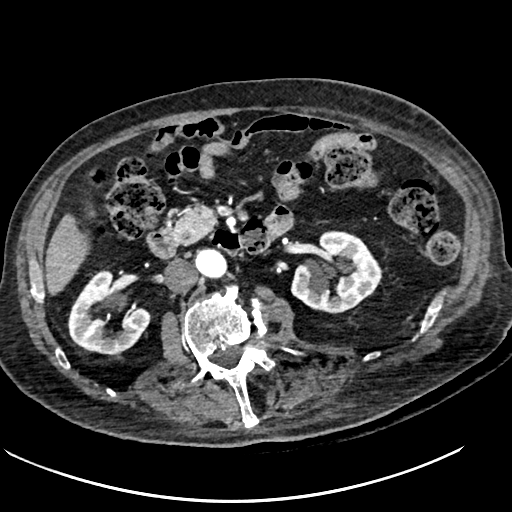
[im 15/189  lung]
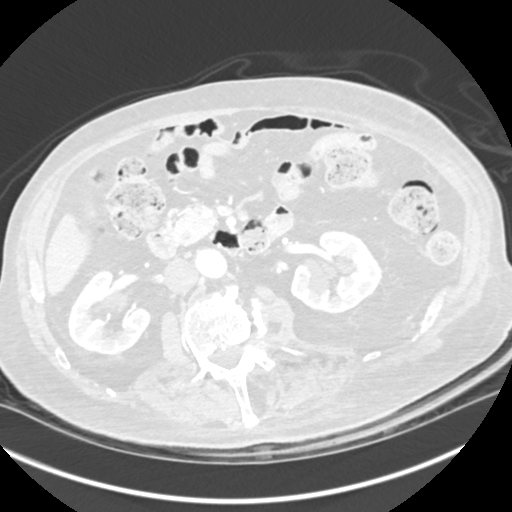
[im 29/189  lung]
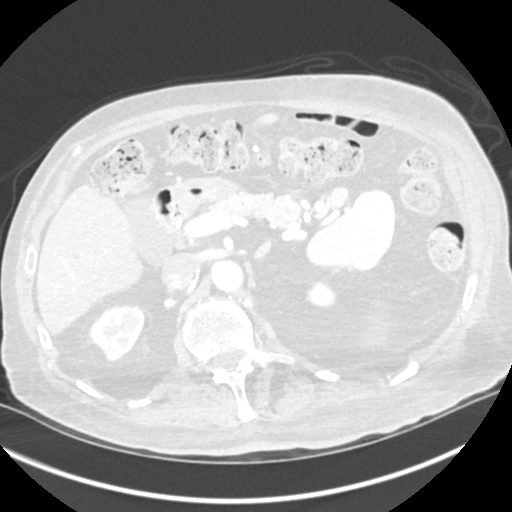
[im 44/189  lung]
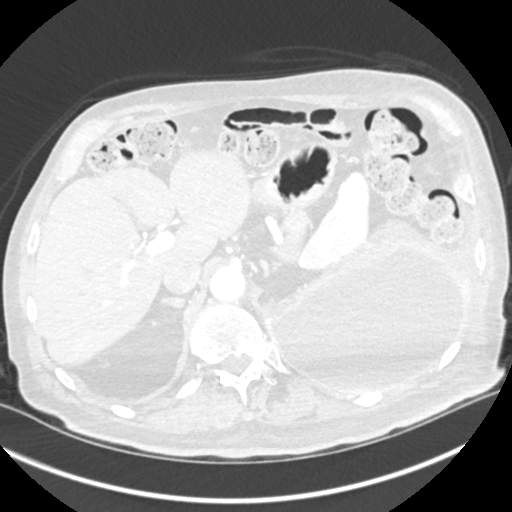
[im 58/189  lung]
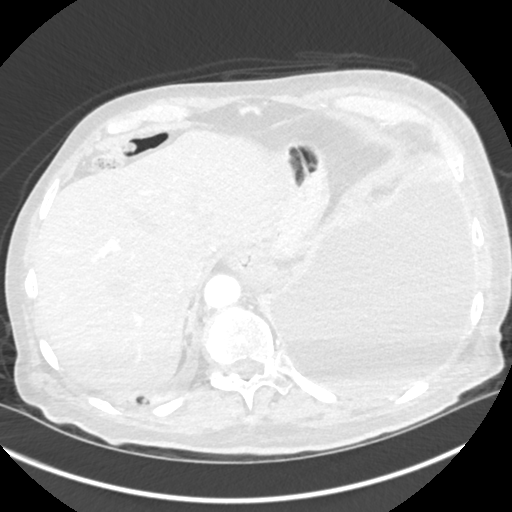
[im 73/189  mediastinal]
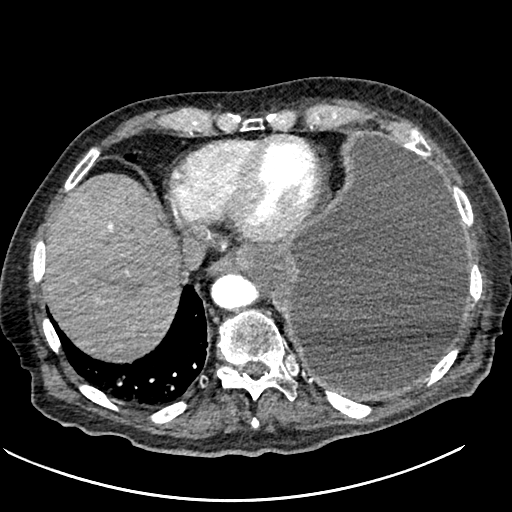
[im 73/189  lung]
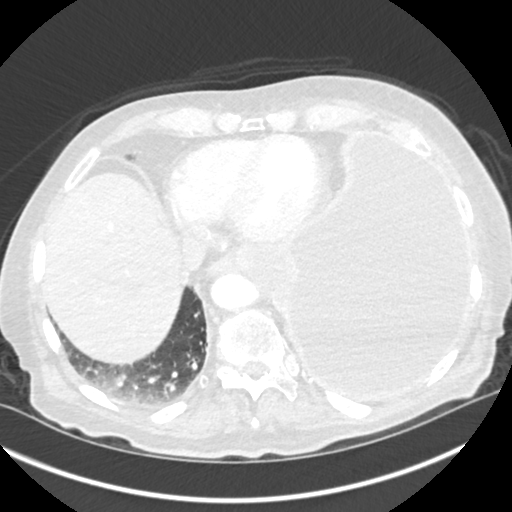
[im 87/189  lung]
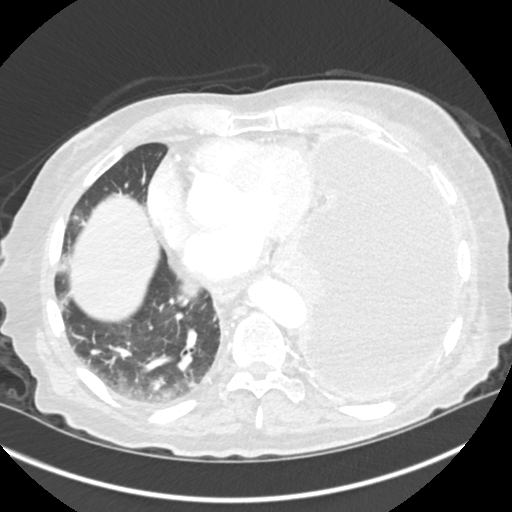
[im 102/189  lung]
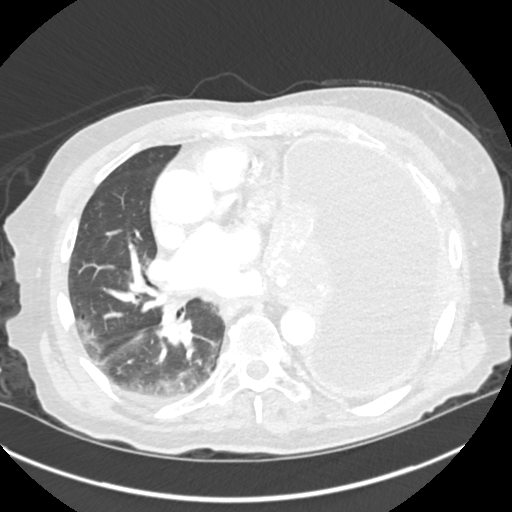
[im 116/189  lung]
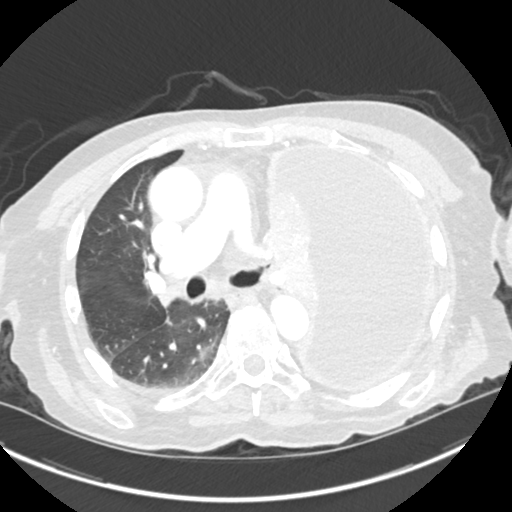
[im 131/189  mediastinal]
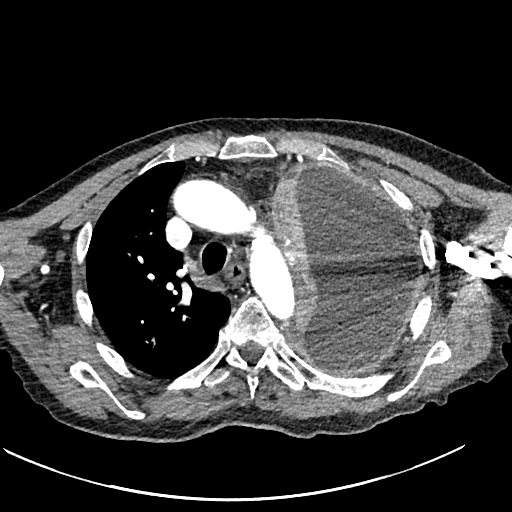
[im 131/189  lung]
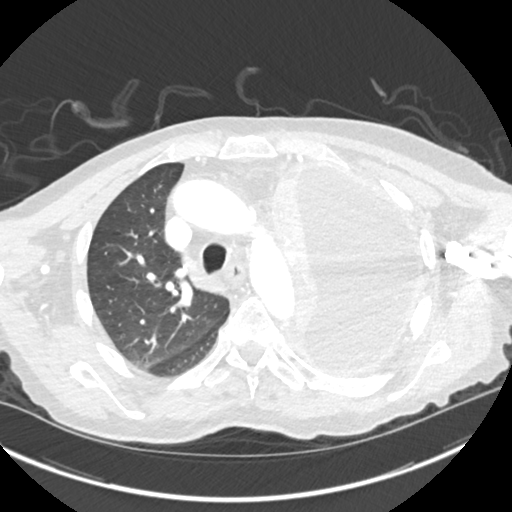
[im 145/189  lung]
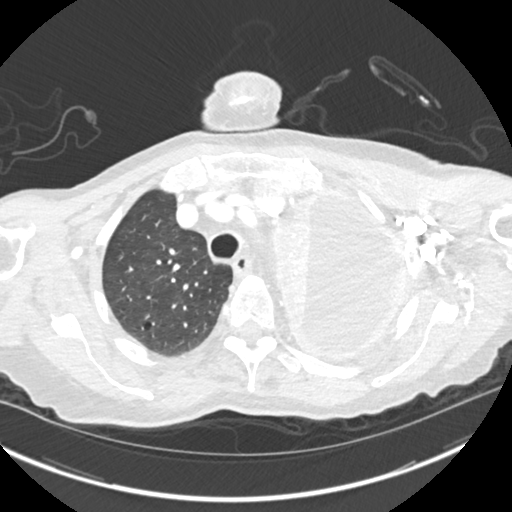
[im 160/189  lung]
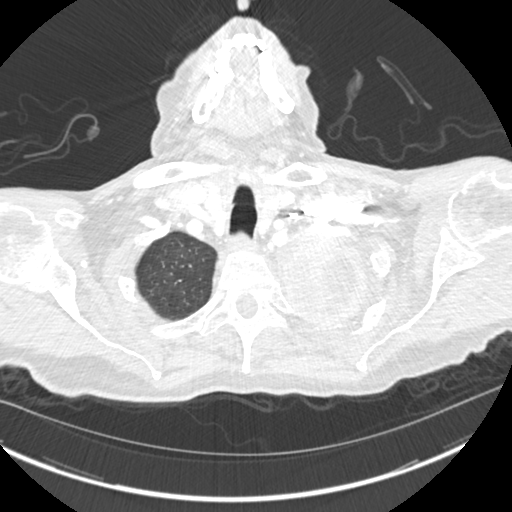
[im 174/189  lung]
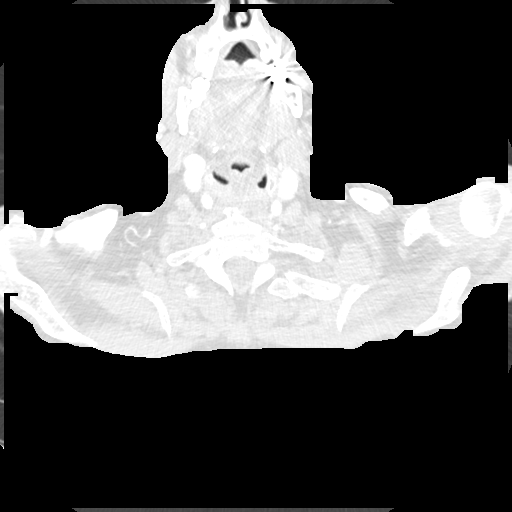

[Series 4: coronal chest 2.00 cor · coronal · 0.69mm/px · 3 of 135 slices shown]
[im 27/135  lung]
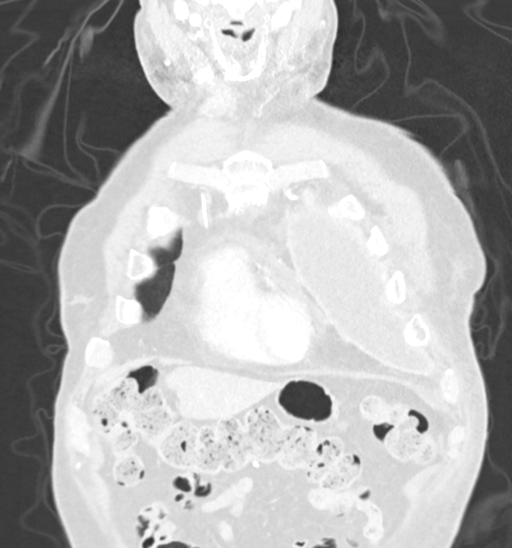
[im 54/135  lung]
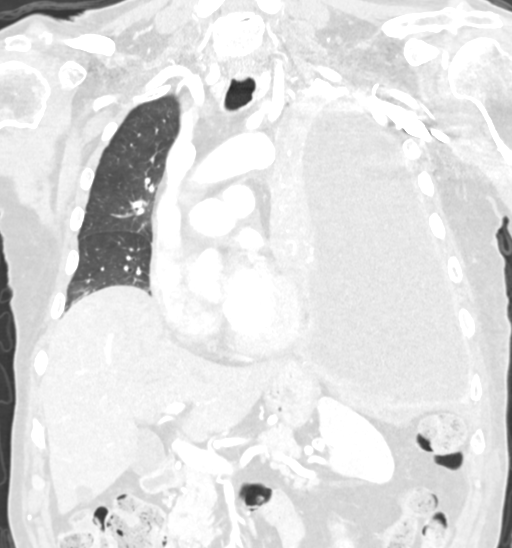
[im 81/135  lung]
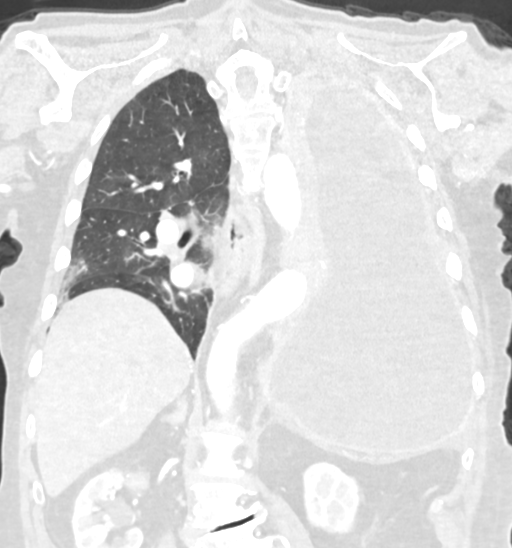

[15 of 36 positions shown; findings below may reference images not displayed]

FINDINGS: Cardiovascular: Normal heart size. No pericardial effusion. Aortic
and coronary atherosclerosis. No acute vascular finding.

Mediastinum/Nodes: Mass with low-density center in the left lower
mediastinum measuring 4.2 cm. This could be a necrotic node or
direct tumor invasion. No contralateral mediastinal adenopathy.

Lungs/Pleura: Complete collapse of the left lung being compressed
medially against the displaced mediastinum. Large left pleural
effusion with circumferential rim of soft tissue density which has
nodular projections into the subpleural fat. There is a vague area
of relative hypoenhancement in the lower left lobe which could be a
mass lesion. Primary pleural tumor is also considered. No signs of
asbestos related pleural disease on the right. The effusion is new
from a 0404 lumbar spine CT.

Upper Abdomen: The adrenal glands are covered and negative. No
visible metastasis throughout the upper abdomen.

Musculoskeletal: Advanced spinal degeneration with scoliosis. No
visible hematogenous bony metastasis. No detected direct invasion of
left-sided ribs.

Insert Dayandur Runa rib
IMPRESSION: Large complex left pleural effusion with diffuse pleural thickening
and nodularity suggesting malignancy. Low-density mass the lower
left mediastinum which could be direct tumor invasion or a malignant
node. Both pulmonary and pleural primaries are considered.

## 2022-02-13 IMAGING — US US THORACENTESIS ASP PLEURAL SPACE W/IMG GUIDE
1 series · 2 of 2 positions shown · non-contrast
Comparison: none

INDICATION: Patient with history of chest discomfort. Found to have a left-sided
pleural effusion. Request is for therapeutic and diagnostic
thoracentesis

[Series 1: us thoracentesis asp pleural s · 2 of 2 slices shown]
[im 1/2]
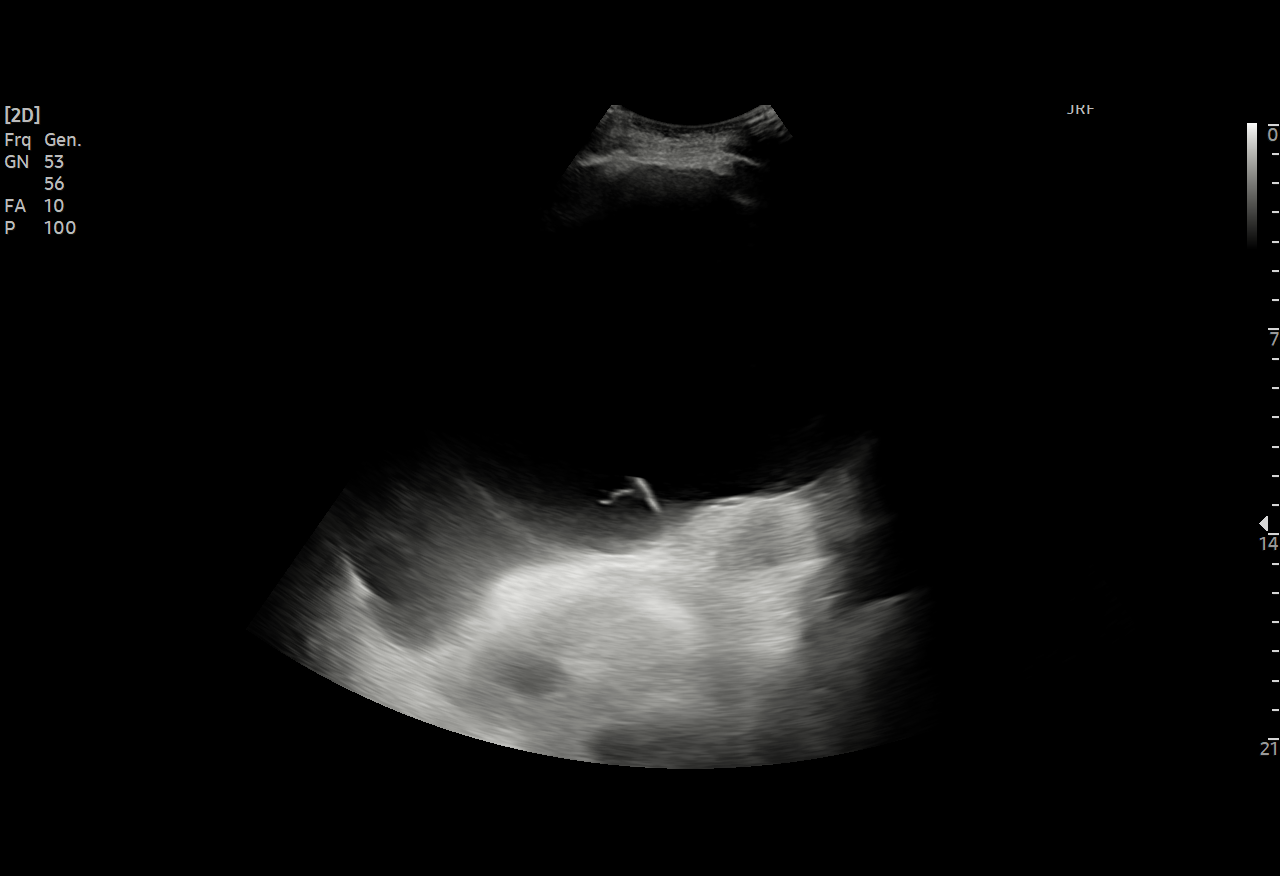
[im 2/2]
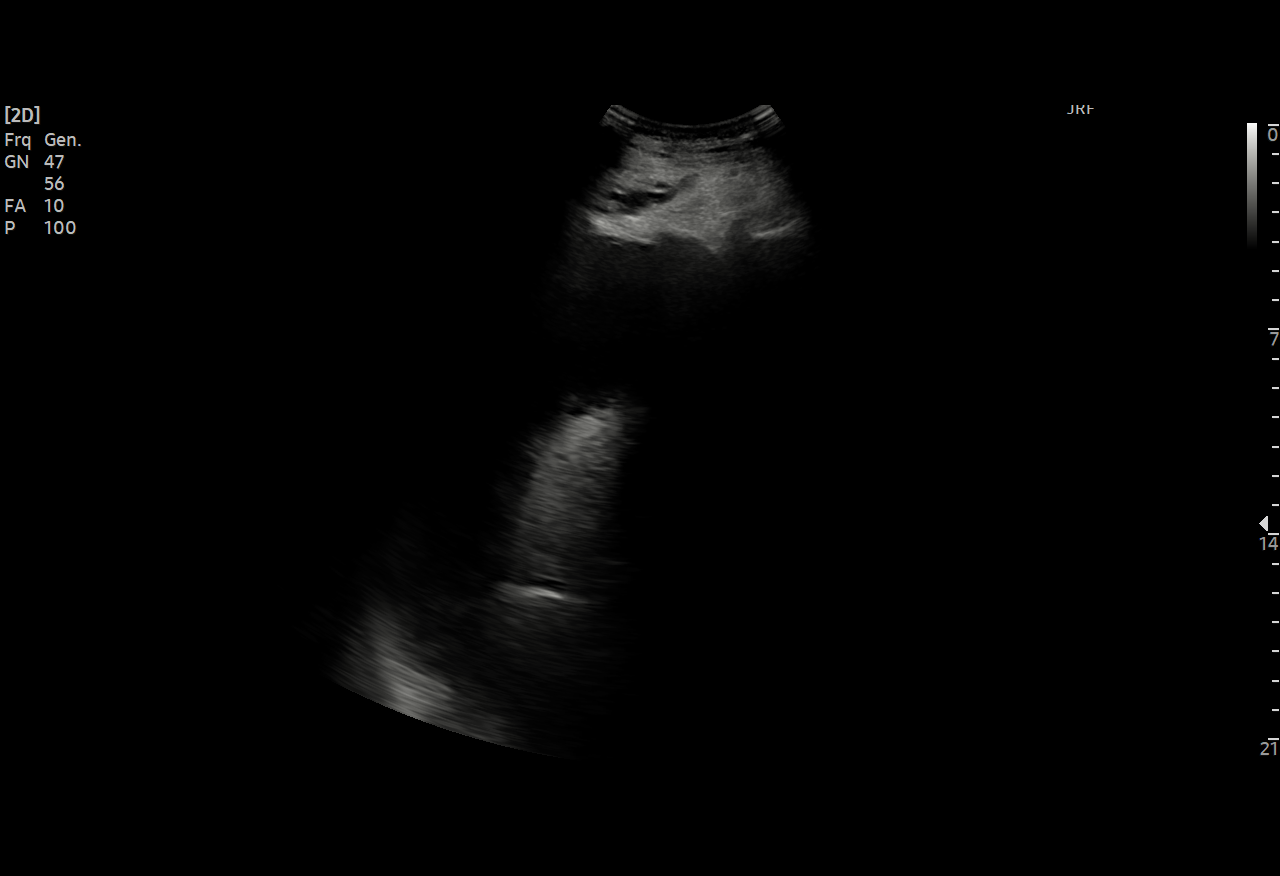

[2 of 2 positions shown; findings below may reference images not displayed]

EXAM:
ULTRASOUND GUIDED THERAPEUTIC AND DIAGNOSTIC LEFT-SIDED
THORACENTESIS

MEDICATIONS:
Lidocaine 1% 10 mL

COMPLICATIONS:
None immediate.

PROCEDURE:
An ultrasound guided thoracentesis was thoroughly discussed with the
patient and questions answered. The benefits, risks, alternatives
and complications were also discussed. The patient understands and
wishes to proceed with the procedure. Written consent was obtained.

Ultrasound was performed to localize and mark an adequate pocket of
fluid in the left chest. The area was then prepped and draped in the
normal sterile fashion. 1% Lidocaine was used for local anesthesia.
Under ultrasound guidance a 6 Fr Safe-T-Centesis catheter was
introduced. Thoracentesis was performed. The catheter was removed
and a dressing applied. Patient unable to tolerate additional fluid
removal at this time.
FINDINGS: A total of approximately 1 L of amber colored fluid was removed.
Samples were sent to the laboratory as requested by the clinical
team.
IMPRESSION: Successful ultrasound guided therapeutic and diagnostic left-sided
thoracentesis yielding 1 L of pleural fluid.

Read by: Christ Cambray, NP
